# Patient Record
Sex: Female | Born: 1950 | Race: Black or African American | Hispanic: No | Smoking: Former smoker
Health system: Southern US, Community
[De-identification: ages and names within clinical notes are randomized; demographics above are authoritative.]

## PROBLEM LIST (undated history)

## (undated) DIAGNOSIS — I493 Ventricular premature depolarization: Secondary | ICD-10-CM

## (undated) DIAGNOSIS — E7800 Pure hypercholesterolemia, unspecified: Secondary | ICD-10-CM

## (undated) DIAGNOSIS — Z8719 Personal history of other diseases of the digestive system: Secondary | ICD-10-CM

## (undated) DIAGNOSIS — M2550 Pain in unspecified joint: Secondary | ICD-10-CM

## (undated) DIAGNOSIS — K760 Fatty (change of) liver, not elsewhere classified: Secondary | ICD-10-CM

## (undated) DIAGNOSIS — K5790 Diverticulosis of intestine, part unspecified, without perforation or abscess without bleeding: Secondary | ICD-10-CM

## (undated) DIAGNOSIS — R51 Headache: Secondary | ICD-10-CM

## (undated) DIAGNOSIS — R112 Nausea with vomiting, unspecified: Secondary | ICD-10-CM

## (undated) DIAGNOSIS — H409 Unspecified glaucoma: Secondary | ICD-10-CM

## (undated) DIAGNOSIS — Z9889 Other specified postprocedural states: Secondary | ICD-10-CM

## (undated) DIAGNOSIS — R519 Headache, unspecified: Secondary | ICD-10-CM

## (undated) DIAGNOSIS — M1990 Unspecified osteoarthritis, unspecified site: Secondary | ICD-10-CM

## (undated) DIAGNOSIS — I10 Essential (primary) hypertension: Secondary | ICD-10-CM

## (undated) DIAGNOSIS — E079 Disorder of thyroid, unspecified: Secondary | ICD-10-CM

## (undated) DIAGNOSIS — Z87440 Personal history of urinary (tract) infections: Secondary | ICD-10-CM

## (undated) HISTORY — PX: COLONOSCOPY: SHX174

## (undated) HISTORY — PX: APPENDECTOMY: SHX54

## (undated) HISTORY — PX: BREAST EXCISIONAL BIOPSY: SUR124

## (undated) HISTORY — PX: JOINT REPLACEMENT: SHX530

---

## 1972-09-21 HISTORY — PX: CHOLECYSTECTOMY: SHX55

## 1976-09-21 HISTORY — PX: TUBAL LIGATION: SHX77

## 1999-09-22 HISTORY — PX: KNEE ARTHROSCOPY: SHX127

## 2000-09-21 HISTORY — PX: BREAST SURGERY: SHX581

## 2000-09-21 HISTORY — PX: CARDIAC CATHETERIZATION: SHX172

## 2011-12-29 ENCOUNTER — Other Ambulatory Visit (HOSPITAL_COMMUNITY)
Admission: RE | Admit: 2011-12-29 | Discharge: 2011-12-29 | Disposition: A | Discharge status: Home / Self Care | Payer: Managed Care, Other (non HMO) | Source: Ambulatory Visit | Attending: Obstetrics and Gynecology | Admitting: Obstetrics and Gynecology

## 2011-12-29 DIAGNOSIS — Z1272 Encounter for screening for malignant neoplasm of vagina: Secondary | ICD-10-CM | POA: Insufficient documentation

## 2013-01-09 ENCOUNTER — Encounter (HOSPITAL_COMMUNITY): Payer: Self-pay | Admitting: Pharmacy Technician

## 2013-01-10 ENCOUNTER — Encounter (HOSPITAL_COMMUNITY)
Admission: RE | Admit: 2013-01-10 | Discharge: 2013-01-10 | Disposition: A | Discharge status: Home / Self Care | Payer: Managed Care, Other (non HMO) | Source: Ambulatory Visit | Attending: Orthopedic Surgery | Admitting: Orthopedic Surgery

## 2013-01-10 ENCOUNTER — Encounter (HOSPITAL_COMMUNITY): Payer: Self-pay

## 2013-01-10 ENCOUNTER — Other Ambulatory Visit: Payer: Self-pay | Admitting: Orthopedic Surgery

## 2013-01-10 ENCOUNTER — Ambulatory Visit (HOSPITAL_COMMUNITY)
Admission: RE | Admit: 2013-01-10 | Discharge: 2013-01-10 | Disposition: A | Discharge status: Home / Self Care | Payer: Managed Care, Other (non HMO) | Source: Ambulatory Visit | Attending: Anesthesiology | Admitting: Anesthesiology

## 2013-01-10 DIAGNOSIS — I10 Essential (primary) hypertension: Secondary | ICD-10-CM | POA: Insufficient documentation

## 2013-01-10 DIAGNOSIS — Z01812 Encounter for preprocedural laboratory examination: Secondary | ICD-10-CM | POA: Insufficient documentation

## 2013-01-10 DIAGNOSIS — Z01818 Encounter for other preprocedural examination: Secondary | ICD-10-CM | POA: Insufficient documentation

## 2013-01-10 HISTORY — DX: Pain in unspecified joint: M25.50

## 2013-01-10 HISTORY — DX: Unspecified osteoarthritis, unspecified site: M19.90

## 2013-01-10 HISTORY — DX: Nausea with vomiting, unspecified: R11.2

## 2013-01-10 HISTORY — DX: Pure hypercholesterolemia, unspecified: E78.00

## 2013-01-10 HISTORY — DX: Disorder of thyroid, unspecified: E07.9

## 2013-01-10 HISTORY — DX: Other specified postprocedural states: Z98.890

## 2013-01-10 HISTORY — DX: Personal history of other diseases of the digestive system: Z87.19

## 2013-01-10 HISTORY — DX: Unspecified glaucoma: H40.9

## 2013-01-10 HISTORY — DX: Essential (primary) hypertension: I10

## 2013-01-10 LAB — BASIC METABOLIC PANEL
CO2: 28 mEq/L (ref 19–32)
Calcium: 10 mg/dL (ref 8.4–10.5)
Creatinine, Ser: 0.99 mg/dL (ref 0.50–1.10)
GFR calc Af Amer: 69 mL/min — ABNORMAL LOW (ref >90)
GFR calc non Af Amer: 59 mL/min — ABNORMAL LOW (ref >90)
Sodium: 138 mEq/L (ref 135–145)

## 2013-01-10 LAB — CBC
HCT: 42.6 % (ref 36.0–46.0)
Hemoglobin: 14.7 g/dL (ref 12.0–15.0)
MCH: 30.3 pg (ref 26.0–34.0)
MCHC: 34.5 g/dL (ref 30.0–36.0)
MCV: 87.8 fL (ref 78.0–100.0)
Platelets: 189 10*3/uL (ref 150–400)
RBC: 4.85 MIL/uL (ref 3.87–5.11)
RDW: 14.1 % (ref 11.5–15.5)
WBC: 5.5 10*3/uL (ref 4.0–10.5)

## 2013-01-10 LAB — PROTIME-INR
INR: 1.05 (ref 0.00–1.49)
Prothrombin Time: 13.6 seconds (ref 11.6–15.2)

## 2013-01-10 LAB — SURGICAL PCR SCREEN
MRSA, PCR: NEGATIVE
Staphylococcus aureus: NEGATIVE

## 2013-01-10 LAB — APTT: aPTT: 36 seconds (ref 24–37)

## 2013-01-10 LAB — TYPE AND SCREEN: ABO/RH(D): O POS

## 2013-01-10 MED ORDER — CHLORHEXIDINE GLUCONATE 4 % EX LIQD: 60.0000 mL | Freq: Once | CUTANEOUS | Status: DC

## 2013-01-10 NOTE — Pre-Procedure Instructions (Signed)
Elizabeth Singh  01/10/2013   Your procedure is scheduled on:  Jan 23, 2013  Report to [facility] at 6:54 AM. (ENTRANCE A)  Call this number if you have problems the morning of surgery: [phone]   Remember:   Do not eat food or drink liquids after midnight.   Take these medicines the morning of surgery with A SIP OF WATER: METOPROLOL(LOPRESSOR), TRAMADOL(ULTRAM) AS NEEDED   Do not wear jewelry, make-up or nail polish.  Do not wear lotions, powders, or perfumes. You may wear deodorant.  Do not shave 48 hours prior to surgery. Men may shave face and neck.  Do not bring valuables to the hospital.  Contacts, dentures or bridgework may not be worn into surgery.  Leave suitcase in the car. After surgery it may be brought to your room.  For patients admitted to the hospital, checkout time is 11:00 AM the day of  discharge.   Patients discharged the day of surgery will not be allowed to drive  home.  Name and phone number of your driver: FAMILY/FRIEND  Special Instructions: Shower using CHG 2 nights before surgery and the night before surgery.  If you shower the day of surgery use CHG.  Use special wash - you have one bottle of CHG for all showers.  You should use approximately 1/3 of the bottle for each shower.   Please read over the following fact sheets that you were given: Pain Booklet, Coughing and Deep Breathing, Blood Transfusion Information, Total Joint Packet and Surgical Site Infection Prevention

## 2013-01-22 MED ORDER — DEXTROSE 5 % IV SOLN
3.0000 g | INTRAVENOUS | Status: AC
  Administered 2013-01-23: 3 g via INTRAVENOUS
  Filled 2013-01-22: qty 3000

## 2013-01-22 MED ORDER — TRANEXAMIC ACID 100 MG/ML IV SOLN
1000.0000 mg | INTRAVENOUS | Status: AC
  Administered 2013-01-23: 1000 mg via INTRAVENOUS
  Filled 2013-01-22: qty 10

## 2013-01-23 ENCOUNTER — Encounter (HOSPITAL_COMMUNITY): Payer: Self-pay | Admitting: Registered Nurse

## 2013-01-23 ENCOUNTER — Encounter (HOSPITAL_COMMUNITY): Payer: Self-pay | Admitting: Anesthesiology

## 2013-01-23 ENCOUNTER — Inpatient Hospital Stay (HOSPITAL_COMMUNITY)
Admission: RE | Admit: 2013-01-23 | Discharge: 2013-01-25 | DRG: 470 | Disposition: A | Discharge status: Home Health | Payer: Managed Care, Other (non HMO) | Source: Ambulatory Visit | Attending: Orthopedic Surgery | Admitting: Orthopedic Surgery

## 2013-01-23 ENCOUNTER — Encounter (HOSPITAL_COMMUNITY)
Admission: RE | Disposition: A | Discharge status: Home Health | Payer: Self-pay | Source: Ambulatory Visit | Attending: Orthopedic Surgery

## 2013-01-23 ENCOUNTER — Inpatient Hospital Stay (HOSPITAL_COMMUNITY): Payer: Managed Care, Other (non HMO) | Admitting: Registered Nurse

## 2013-01-23 DIAGNOSIS — Z79899 Other long term (current) drug therapy: Secondary | ICD-10-CM

## 2013-01-23 DIAGNOSIS — K219 Gastro-esophageal reflux disease without esophagitis: Secondary | ICD-10-CM | POA: Diagnosis present

## 2013-01-23 DIAGNOSIS — I10 Essential (primary) hypertension: Secondary | ICD-10-CM | POA: Diagnosis present

## 2013-01-23 DIAGNOSIS — E7800 Pure hypercholesterolemia, unspecified: Secondary | ICD-10-CM | POA: Diagnosis present

## 2013-01-23 DIAGNOSIS — M1710 Unilateral primary osteoarthritis, unspecified knee: Principal | ICD-10-CM | POA: Diagnosis present

## 2013-01-23 DIAGNOSIS — M1711 Unilateral primary osteoarthritis, right knee: Secondary | ICD-10-CM

## 2013-01-23 HISTORY — PX: TOTAL KNEE ARTHROPLASTY: SHX125

## 2013-01-23 LAB — CBC
MCH: 29.4 pg (ref 26.0–34.0)
Platelets: 174 10*3/uL (ref 150–400)
RBC: 4.12 MIL/uL (ref 3.87–5.11)
WBC: 11.2 10*3/uL — ABNORMAL HIGH (ref 4.0–10.5)

## 2013-01-23 LAB — CREATININE, SERUM
Creatinine, Ser: 0.77 mg/dL (ref 0.50–1.10)
GFR calc non Af Amer: 88 mL/min — ABNORMAL LOW (ref >90)

## 2013-01-23 SURGERY — ARTHROPLASTY, KNEE, TOTAL
Anesthesia: General | Site: Knee | Laterality: Right | Wound class: Clean

## 2013-01-23 MED ORDER — OXYCODONE HCL 5 MG PO TABS
5.0000 mg | ORAL_TABLET | Freq: Once | ORAL | Status: AC
  Administered 2013-01-23: 5 mg via ORAL

## 2013-01-23 MED ORDER — FENTANYL CITRATE 0.05 MG/ML IJ SOLN
INTRAMUSCULAR | Status: DC | PRN
  Administered 2013-01-23: 150 ug via INTRAVENOUS
  Administered 2013-01-23 (×6): 50 ug via INTRAVENOUS

## 2013-01-23 MED ORDER — CEFAZOLIN SODIUM-DEXTROSE 2-3 GM-% IV SOLR
2.0000 g | Freq: Four times a day (QID) | INTRAVENOUS | Status: AC
  Administered 2013-01-23 (×2): 2 g via INTRAVENOUS
  Filled 2013-01-23 (×2): qty 50

## 2013-01-23 MED ORDER — BISACODYL 5 MG PO TBEC: 5.0000 mg | DELAYED_RELEASE_TABLET | Freq: Every day | ORAL | Status: DC | PRN

## 2013-01-23 MED ORDER — BUPIVACAINE-EPINEPHRINE PF 0.25-1:200000 % IJ SOLN
INTRAMUSCULAR | Status: AC
  Filled 2013-01-23: qty 30

## 2013-01-23 MED ORDER — HYDROMORPHONE HCL PF 1 MG/ML IJ SOLN
INTRAMUSCULAR | Status: AC
  Filled 2013-01-23: qty 1

## 2013-01-23 MED ORDER — DOCUSATE SODIUM 100 MG PO CAPS
100.0000 mg | ORAL_CAPSULE | Freq: Two times a day (BID) | ORAL | Status: DC
  Administered 2013-01-23 – 2013-01-25 (×5): 100 mg via ORAL
  Filled 2013-01-23 (×5): qty 1

## 2013-01-23 MED ORDER — ALUM & MAG HYDROXIDE-SIMETH 200-200-20 MG/5ML PO SUSP: 30.0000 mL | ORAL | Status: DC | PRN

## 2013-01-23 MED ORDER — SODIUM CHLORIDE 0.9 % IR SOLN
Status: DC | PRN
  Administered 2013-01-23: 3000 mL

## 2013-01-23 MED ORDER — BUPIVACAINE ON-Q PAIN PUMP (FOR ORDER SET NO CHG)
INJECTION | Status: DC
Start: 2013-01-23 — End: 2013-01-25
  Filled 2013-01-23: qty 1

## 2013-01-23 MED ORDER — METOCLOPRAMIDE HCL 10 MG PO TABS: 5.0000 mg | ORAL_TABLET | Freq: Three times a day (TID) | ORAL | Status: DC | PRN

## 2013-01-23 MED ORDER — METHOCARBAMOL 100 MG/ML IJ SOLN: 500.0000 mg | Freq: Four times a day (QID) | INTRAVENOUS | Status: DC | PRN

## 2013-01-23 MED ORDER — ONDANSETRON HCL 4 MG/2ML IJ SOLN
INTRAMUSCULAR | Status: DC | PRN
  Administered 2013-01-23 (×2): 4 mg via INTRAVENOUS

## 2013-01-23 MED ORDER — ONDANSETRON HCL 4 MG PO TABS: 4.0000 mg | ORAL_TABLET | Freq: Four times a day (QID) | ORAL | Status: DC | PRN

## 2013-01-23 MED ORDER — ACETAMINOPHEN 10 MG/ML IV SOLN
1000.0000 mg | Freq: Once | INTRAVENOUS | Status: AC
  Administered 2013-01-23: 1000 mg via INTRAVENOUS

## 2013-01-23 MED ORDER — OXYCODONE HCL 5 MG PO TABS
5.0000 mg | ORAL_TABLET | ORAL | Status: DC | PRN
  Administered 2013-01-23 – 2013-01-25 (×10): 10 mg via ORAL
  Filled 2013-01-23 (×11): qty 2

## 2013-01-23 MED ORDER — ZOLPIDEM TARTRATE 5 MG PO TABS: 5.0000 mg | ORAL_TABLET | Freq: Every evening | ORAL | Status: DC | PRN

## 2013-01-23 MED ORDER — SENNOSIDES-DOCUSATE SODIUM 8.6-50 MG PO TABS: 1.0000 | ORAL_TABLET | Freq: Every evening | ORAL | Status: DC | PRN

## 2013-01-23 MED ORDER — SODIUM CHLORIDE 0.9 % IV SOLN
INTRAVENOUS | Status: DC
  Administered 2013-01-23 – 2013-01-24 (×2): via INTRAVENOUS

## 2013-01-23 MED ORDER — POTASSIUM CHLORIDE CRYS ER 20 MEQ PO TBCR
20.0000 meq | EXTENDED_RELEASE_TABLET | Freq: Every day | ORAL | Status: DC
  Administered 2013-01-23 – 2013-01-25 (×3): 20 meq via ORAL
  Filled 2013-01-23 (×4): qty 1

## 2013-01-23 MED ORDER — BUPIVACAINE-EPINEPHRINE 0.25% -1:200000 IJ SOLN
INTRAMUSCULAR | Status: DC | PRN
  Administered 2013-01-23: 20 mL

## 2013-01-23 MED ORDER — PHENOL 1.4 % MT LIQD: 1.0000 | OROMUCOSAL | Status: DC | PRN

## 2013-01-23 MED ORDER — PHENYLEPHRINE HCL 10 MG/ML IJ SOLN
INTRAMUSCULAR | Status: DC | PRN
  Administered 2013-01-23: 80 ug via INTRAVENOUS
  Administered 2013-01-23: 120 ug via INTRAVENOUS
  Administered 2013-01-23: 80 ug via INTRAVENOUS

## 2013-01-23 MED ORDER — ONDANSETRON HCL 4 MG/2ML IJ SOLN: 4.0000 mg | Freq: Once | INTRAMUSCULAR | Status: DC | PRN

## 2013-01-23 MED ORDER — HYDROMORPHONE HCL PF 1 MG/ML IJ SOLN
0.2500 mg | INTRAMUSCULAR | Status: DC | PRN
  Administered 2013-01-23 (×4): 0.5 mg via INTRAVENOUS

## 2013-01-23 MED ORDER — HYDROMORPHONE HCL PF 1 MG/ML IJ SOLN
1.0000 mg | INTRAMUSCULAR | Status: DC | PRN
  Administered 2013-01-23 – 2013-01-24 (×2): 1 mg via INTRAVENOUS
  Administered 2013-01-24: 0.5 mg via INTRAVENOUS
  Filled 2013-01-23 (×3): qty 1

## 2013-01-23 MED ORDER — METOPROLOL TARTRATE 50 MG PO TABS
50.0000 mg | ORAL_TABLET | Freq: Two times a day (BID) | ORAL | Status: DC
  Administered 2013-01-23 – 2013-01-25 (×4): 50 mg via ORAL
  Filled 2013-01-23 (×6): qty 1

## 2013-01-23 MED ORDER — ONDANSETRON HCL 4 MG/2ML IJ SOLN: 4.0000 mg | Freq: Four times a day (QID) | INTRAMUSCULAR | Status: DC | PRN

## 2013-01-23 MED ORDER — METHOCARBAMOL 500 MG PO TABS
ORAL_TABLET | ORAL | Status: AC
  Administered 2013-01-23: 500 mg
  Filled 2013-01-23: qty 1

## 2013-01-23 MED ORDER — BUPIVACAINE 0.25 % ON-Q PUMP SINGLE CATH 300ML
300.0000 mL | INJECTION | Status: DC
  Filled 2013-01-23: qty 300

## 2013-01-23 MED ORDER — OXYCODONE HCL ER 10 MG PO T12A
10.0000 mg | EXTENDED_RELEASE_TABLET | Freq: Two times a day (BID) | ORAL | Status: DC
  Administered 2013-01-23 – 2013-01-25 (×5): 10 mg via ORAL
  Filled 2013-01-23 (×5): qty 1

## 2013-01-23 MED ORDER — METOCLOPRAMIDE HCL 5 MG/ML IJ SOLN
5.0000 mg | Freq: Three times a day (TID) | INTRAMUSCULAR | Status: DC | PRN
  Administered 2013-01-23 – 2013-01-24 (×2): 10 mg via INTRAVENOUS
  Filled 2013-01-23 (×2): qty 2

## 2013-01-23 MED ORDER — SODIUM CHLORIDE 0.9 % IV SOLN: INTRAVENOUS | Status: DC

## 2013-01-23 MED ORDER — LIDOCAINE HCL (CARDIAC) 20 MG/ML IV SOLN
INTRAVENOUS | Status: DC | PRN
  Administered 2013-01-23: 80 mg via INTRAVENOUS

## 2013-01-23 MED ORDER — ACETAMINOPHEN 10 MG/ML IV SOLN
1000.0000 mg | Freq: Four times a day (QID) | INTRAVENOUS | Status: AC
  Administered 2013-01-23 (×3): 1000 mg via INTRAVENOUS
  Filled 2013-01-23 (×4): qty 100

## 2013-01-23 MED ORDER — LACTATED RINGERS IV SOLN
INTRAVENOUS | Status: DC | PRN
  Administered 2013-01-23 (×2): via INTRAVENOUS

## 2013-01-23 MED ORDER — MENTHOL 3 MG MT LOZG: 1.0000 | LOZENGE | OROMUCOSAL | Status: DC | PRN

## 2013-01-23 MED ORDER — ACETAMINOPHEN 10 MG/ML IV SOLN
INTRAVENOUS | Status: AC
  Filled 2013-01-23: qty 100

## 2013-01-23 MED ORDER — DIPHENHYDRAMINE HCL 12.5 MG/5ML PO ELIX: 12.5000 mg | ORAL_SOLUTION | ORAL | Status: DC | PRN

## 2013-01-23 MED ORDER — PROPOFOL 10 MG/ML IV BOLUS
INTRAVENOUS | Status: DC | PRN
  Administered 2013-01-23: 320 mg via INTRAVENOUS

## 2013-01-23 MED ORDER — TRANEXAMIC ACID 100 MG/ML IV SOLN: 1000.0000 mg | INTRAVENOUS | Status: AC

## 2013-01-23 MED ORDER — FLEET ENEMA 7-19 GM/118ML RE ENEM: 1.0000 | ENEMA | Freq: Once | RECTAL | Status: AC | PRN

## 2013-01-23 MED ORDER — ENOXAPARIN SODIUM 30 MG/0.3ML ~~LOC~~ SOLN
30.0000 mg | Freq: Two times a day (BID) | SUBCUTANEOUS | Status: DC
  Administered 2013-01-24 – 2013-01-25 (×3): 30 mg via SUBCUTANEOUS
  Filled 2013-01-23 (×6): qty 0.3

## 2013-01-23 MED ORDER — ACETAMINOPHEN 325 MG PO TABS
650.0000 mg | ORAL_TABLET | Freq: Four times a day (QID) | ORAL | Status: DC | PRN
  Administered 2013-01-24: 650 mg via ORAL
  Filled 2013-01-23: qty 2

## 2013-01-23 MED ORDER — BUPIVACAINE 0.25 % ON-Q PUMP SINGLE CATH 300ML
INJECTION | Status: DC | PRN
  Administered 2013-01-23: 300 mL

## 2013-01-23 MED ORDER — ACETAMINOPHEN 650 MG RE SUPP: 650.0000 mg | Freq: Four times a day (QID) | RECTAL | Status: DC | PRN

## 2013-01-23 MED ORDER — METHOCARBAMOL 500 MG PO TABS
500.0000 mg | ORAL_TABLET | Freq: Four times a day (QID) | ORAL | Status: DC | PRN
  Administered 2013-01-23 – 2013-01-25 (×6): 500 mg via ORAL
  Filled 2013-01-23 (×6): qty 1

## 2013-01-23 MED ORDER — TRIAMTERENE-HCTZ 75-50 MG PO TABS
1.0000 | ORAL_TABLET | Freq: Every day | ORAL | Status: DC
  Administered 2013-01-23 – 2013-01-24 (×2): 1 via ORAL
  Filled 2013-01-23 (×3): qty 1

## 2013-01-23 MED ORDER — OXYCODONE HCL 5 MG PO TABS
ORAL_TABLET | ORAL | Status: AC
  Filled 2013-01-23: qty 1

## 2013-01-23 MED ORDER — LATANOPROST 0.005 % OP SOLN
1.0000 [drp] | Freq: Every day | OPHTHALMIC | Status: DC
  Administered 2013-01-23 – 2013-01-24 (×2): 1 [drp] via OPHTHALMIC
  Filled 2013-01-23: qty 2.5

## 2013-01-23 MED ORDER — MIDAZOLAM HCL 5 MG/5ML IJ SOLN
INTRAMUSCULAR | Status: DC | PRN
  Administered 2013-01-23 (×2): 1 mg via INTRAVENOUS

## 2013-01-23 SURGICAL SUPPLY — 59 items
BANDAGE ESMARK 6X9 LF (GAUZE/BANDAGES/DRESSINGS) ×1 IMPLANT
BLADE SAGITTAL 13X1.27X60 (BLADE) ×2 IMPLANT
BLADE SAW SGTL 83.5X18.5 (BLADE) ×2 IMPLANT
BNDG ESMARK 6X9 LF (GAUZE/BANDAGES/DRESSINGS) ×2
BOWL SMART MIX CTS (DISPOSABLE) ×2 IMPLANT
CATH KIT ON Q 5IN SLV (PAIN MANAGEMENT) ×2 IMPLANT
CEMENT BONE SIMPLEX SPEEDSET (Cement) ×4 IMPLANT
CLOTH BEACON ORANGE TIMEOUT ST (SAFETY) ×2 IMPLANT
COVER SURGICAL LIGHT HANDLE (MISCELLANEOUS) ×2 IMPLANT
CUFF TOURNIQUET SINGLE 34IN LL (TOURNIQUET CUFF) ×2 IMPLANT
DRAPE EXTREMITY T 121X128X90 (DRAPE) ×2 IMPLANT
DRAPE INCISE IOBAN 66X45 STRL (DRAPES) ×4 IMPLANT
DRAPE PROXIMA HALF (DRAPES) ×2 IMPLANT
DRAPE U-SHAPE 47X51 STRL (DRAPES) ×2 IMPLANT
DRSG ADAPTIC 3X8 NADH LF (GAUZE/BANDAGES/DRESSINGS) ×2 IMPLANT
DRSG PAD ABDOMINAL 8X10 ST (GAUZE/BANDAGES/DRESSINGS) ×2 IMPLANT
DURAPREP 26ML APPLICATOR (WOUND CARE) ×4 IMPLANT
ELECT REM PT RETURN 9FT ADLT (ELECTROSURGICAL) ×2
ELECTRODE REM PT RTRN 9FT ADLT (ELECTROSURGICAL) ×1 IMPLANT
EVACUATOR 1/8 PVC DRAIN (DRAIN) ×2 IMPLANT
GLOVE BIO SURGEON STRL SZ 6.5 (GLOVE) ×6 IMPLANT
GLOVE BIO SURGEON STRL SZ8.5 (GLOVE) ×6 IMPLANT
GLOVE BIOGEL M 7.0 STRL (GLOVE) ×2 IMPLANT
GLOVE BIOGEL PI IND STRL 7.5 (GLOVE) ×1 IMPLANT
GLOVE BIOGEL PI IND STRL 8.5 (GLOVE) ×2 IMPLANT
GLOVE BIOGEL PI INDICATOR 7.5 (GLOVE) ×1
GLOVE BIOGEL PI INDICATOR 8.5 (GLOVE) ×2
GLOVE BIOGEL PI ORTHO PRO SZ8 (GLOVE) ×1
GLOVE PI ORTHO PRO STRL SZ8 (GLOVE) ×1 IMPLANT
GLOVE SURG ORTHO 8.0 STRL STRW (GLOVE) ×4 IMPLANT
GLOVE SURG SS PI 8.5 STRL IVOR (GLOVE) ×5
GLOVE SURG SS PI 8.5 STRL STRW (GLOVE) ×5 IMPLANT
GOWN PREVENTION PLUS XLARGE (GOWN DISPOSABLE) ×4 IMPLANT
GOWN STRL NON-REIN LRG LVL3 (GOWN DISPOSABLE) ×4 IMPLANT
HANDPIECE INTERPULSE COAX TIP (DISPOSABLE) ×1
HOOD PEEL AWAY FACE SHEILD DIS (HOOD) ×8 IMPLANT
KIT BASIN OR (CUSTOM PROCEDURE TRAY) ×2 IMPLANT
KIT ROOM TURNOVER OR (KITS) ×2 IMPLANT
MANIFOLD NEPTUNE II (INSTRUMENTS) ×2 IMPLANT
NEEDLE 22X1 1/2 (OR ONLY) (NEEDLE) ×2 IMPLANT
NS IRRIG 1000ML POUR BTL (IV SOLUTION) ×2 IMPLANT
PACK TOTAL JOINT (CUSTOM PROCEDURE TRAY) ×2 IMPLANT
PAD ARMBOARD 7.5X6 YLW CONV (MISCELLANEOUS) ×4 IMPLANT
PADDING CAST COTTON 6X4 STRL (CAST SUPPLIES) ×2 IMPLANT
SET HNDPC FAN SPRY TIP SCT (DISPOSABLE) ×1 IMPLANT
SPONGE GAUZE 4X4 12PLY (GAUZE/BANDAGES/DRESSINGS) ×2 IMPLANT
STAPLER VISISTAT 35W (STAPLE) ×2 IMPLANT
SUCTION FRAZIER TIP 10 FR DISP (SUCTIONS) ×2 IMPLANT
SUT BONE WAX W31G (SUTURE) ×2 IMPLANT
SUT VIC AB 0 CTB1 27 (SUTURE) ×4 IMPLANT
SUT VIC AB 1 CT1 27 (SUTURE) ×2
SUT VIC AB 1 CT1 27XBRD ANBCTR (SUTURE) ×2 IMPLANT
SUT VIC AB 2-0 CT1 27 (SUTURE) ×2
SUT VIC AB 2-0 CT1 TAPERPNT 27 (SUTURE) ×2 IMPLANT
SYR CONTROL 10ML LL (SYRINGE) ×2 IMPLANT
TOWEL OR 17X24 6PK STRL BLUE (TOWEL DISPOSABLE) ×2 IMPLANT
TOWEL OR 17X26 10 PK STRL BLUE (TOWEL DISPOSABLE) ×2 IMPLANT
TRAY FOLEY CATH 14FR (SET/KITS/TRAYS/PACK) ×2 IMPLANT
WATER STERILE IRR 1000ML POUR (IV SOLUTION) ×4 IMPLANT

## 2013-01-23 NOTE — Anesthesia Postprocedure Evaluation (Signed)
  Anesthesia Post-op Note  Patient: Elizabeth Singh  Procedure(s) Performed: Procedure(s): TOTAL KNEE ARTHROPLASTY (Right)  Patient Location: PACU  Anesthesia Type:GA combined with regional for post-op pain  Level of Consciousness: awake, oriented and patient cooperative  Airway and Oxygen Therapy: Patient Spontanous Breathing  Post-op Pain: mild  Post-op Assessment: Post-op Vital signs reviewed, Patient's Cardiovascular Status Stable, Respiratory Function Stable, Patent Airway, No signs of Nausea or vomiting and Pain level controlled  Post-op Vital Signs: stable  Complications: No apparent anesthesia complications

## 2013-01-23 NOTE — Evaluation (Signed)
Physical Therapy Evaluation Patient Details Name: Elizabeth Singh MRN: [id number] DOB: 01/11/1951 Today's Date: 01/23/2013 Time: 1330-1400 PT Time Calculation (min): 30 min  PT Assessment / Plan / Recommendation Clinical Impression  Pt is a 62 y.o. female s/p R TKA POD#0. Pt limited in therapy today secondary to drowsiness and dizziniess due to medication. Pt preseents with decreased mobility, strength and ROM in R knee. Would benefit from skilled PT to maximize functional mobility. Plans to have 24/7 care by daugther upon D/C. Recommend HHPT.     PT Assessment  Patient needs continued PT services    Follow Up Recommendations  Home health PT;Supervision/Assistance - 24 hour    Does the patient have the potential to tolerate intense rehabilitation      Barriers to Discharge        Equipment Recommendations  None recommended by PT    Recommendations for Other Services     Frequency 7X/week    Precautions / Restrictions Precautions Precautions: Fall;Knee Precaution Booklet Issued: Yes (comment) Restrictions Weight Bearing Restrictions: Yes RLE Weight Bearing: Weight bearing as tolerated   Pertinent Vitals/Pain C/o 8/10 in R knee with activity; pt repositioned in bed.       Mobility  Bed Mobility Bed Mobility: Supine to Sit;Sit to Supine;Sitting - Scoot to Edge of Bed Supine to Sit: 4: Min assist;HOB elevated (HOB at 35degrees) Sitting - Scoot to Edge of Bed: 4: Min guard Sit to Supine: 4: Min guard;HOB flat;With rail Details for Bed Mobility Assistance: pt requires increased time to complete tasks secondary to pain; multi modal cues for hand placement and sequencing; pt demo good technique; using bil UEs to assist R LE. Transfers Transfers: Sit to Stand;Stand to Sit Sit to Stand: 4: Min assist;From bed;From elevated surface;With upper extremity assist Stand to Sit: 4: Min assist;To bed;With upper extremity assist Details for Transfer Assistance: pt c/o dizziniess with sit  <> stand; tolerated static standing x ; required return to supine position; required multimodal cues for hand placement and safety; demo decreased safety awareness.  Ambulation/Gait Ambulation/Gait Assistance: Not tested (comment) Stairs: No Wheelchair Mobility Wheelchair Mobility: No    Exercises     PT Diagnosis: Difficulty walking;Acute pain  PT Problem List: Decreased strength;Decreased range of motion;Decreased balance;Decreased mobility;Decreased knowledge of use of DME;Decreased safety awareness;Pain PT Treatment Interventions: DME instruction;Gait training;Functional mobility training;Therapeutic activities;Therapeutic exercise;Balance training;Neuromuscular re-education;Patient/family education   PT Goals Acute Rehab PT Goals PT Goal Formulation: With patient Time For Goal Achievement: 01/30/13 Potential to Achieve Goals: Good Pt will go Supine/Side to Sit: with modified independence PT Goal: Supine/Side to Sit - Progress: Goal set today Pt will go Sit to Stand: with supervision PT Goal: Sit to Stand - Progress: Goal set today Pt will go Stand to Sit: with modified independence PT Goal: Stand to Sit - Progress: Goal set today Pt will Ambulate: >150 feet;with rolling walker;with modified independence PT Goal: Ambulate - Progress: Goal set today Pt will Perform Home Exercise Program: with supervision, verbal cues required/provided PT Goal: Perform Home Exercise Program - Progress: Goal set today  Visit Information  Last PT Received On: 01/23/13 Assistance Needed: +1    Subjective Data  Subjective: "not feeling too much pain right now." Lying supine; agreeable to therapy Patient Stated Goal: to go home with daughter    Prior Functioning  Home Living Lives With: Alone Available Help at Discharge: Family;Available 24 hours/day (for 1 week) Type of Home: House Home Layout: One level Bathroom Shower/Tub: Engineer, manufacturing systems: Standard  Bathroom  Accessibility: Yes How Accessible: Accessible via walker Home Adaptive Equipment: Bedside commode/3-in-1;Walker - rolling Prior Function Level of Independence: Independent Able to Take Stairs?: Yes Driving: Yes Vocation: Full time employment Communication Communication: No difficulties Dominant Hand: Right    Cognition  Cognition Arousal/Alertness: Lethargic Behavior During Therapy: WFL for tasks assessed/performed Overall Cognitive Status: Within Functional Limits for tasks assessed    Extremity/Trunk Assessment Right Lower Extremity Assessment RLE ROM/Strength/Tone: Deficits;Unable to fully assess;Due to pain RLE ROM/Strength/Tone Deficits: required (A) for SLR and LAQ; c/o pain with activity  RLE Sensation: WFL - Light Touch Left Lower Extremity Assessment LLE ROM/Strength/Tone: WFL for tasks assessed LLE Sensation: WFL - Light Touch Trunk Assessment Trunk Assessment: Normal   Balance Balance Balance Assessed: Yes Static Standing Balance Static Standing - Balance Support: Bilateral upper extremity supported Static Standing - Level of Assistance: 4: Min assist  End of Session PT - End of Session Equipment Utilized During Treatment: Gait belt Activity Tolerance: Patient limited by fatigue;Patient limited by pain;Treatment limited secondary to medication;Other (comment) (pt very drowsy) Patient left: in bed;with call bell/phone within reach;with nursing in room;with family/visitor present Nurse Communication: Mobility status CPM Right Knee CPM Right Knee: Off Right Knee Flexion (Degrees): 90 Right Knee Extension (Degrees): 0  GP     Shelva Majestic [name], [place] [phone] 01/23/2013, 2:39 PM

## 2013-01-23 NOTE — H&P (Signed)
Elizabeth Singh MRN:  [id number] DOB/SEX:  1951/08/18/female  CHIEF COMPLAINT:  Painful right Knee  HISTORY: Patient is a 62 y.o. female presented with a history of pain in the right knee. Onset of symptoms was gradual starting several years ago with gradually worsening course since that time. Prior procedures on the knee include arthroscopy. Patient has been treated conservatively with over-the-counter NSAIDs and activity modification. Patient currently rates pain in the knee at 9 out of 10 with activity. There is pain at night.  PAST MEDICAL HISTORY: There are no active problems to display for this patient.  Past Medical History  Diagnosis Date  . PONV (postoperative nausea and vomiting)     once post surgery  . Hypertension   . High cholesterol   . Arthritis   . Joint pain   . GERD (gastroesophageal reflux disease)   . H/O hiatal hernia   . Thyroid disorder   . Glaucoma    Past Surgical History  Procedure Laterality Date  . Cholecystectomy  1974  . Tubal ligation  1978  . Breast surgery  2002    left benign lumpectomy  . Knee arthroscopy  2001    right  . Cardiac catheterization  2002     MEDICATIONS:   Prescriptions prior to admission  Medication Sig Dispense Refill  . amiloride-hydrochlorothiazide (MODURETIC) 5-50 MG tablet Take 1 tablet by mouth daily.      . diflunisal (DOLOBID) 500 MG TABS Take 500 mg by mouth 2 (two) times daily.      [name] Kitchen KRILL OIL PO Take 1 tablet by mouth daily.      [name] Kitchen latanoprost (XALATAN) 0.005 % ophthalmic solution Place 1 drop into both eyes at bedtime.      . metoprolol (LOPRESSOR) 50 MG tablet Take 50 mg by mouth 2 (two) times daily.      . potassium chloride SA (K-DUR,KLOR-CON) 20 MEQ tablet Take 20 mEq by mouth every morning.      . traMADol (ULTRAM) 50 MG tablet Take 25 mg by mouth 2 (two) times daily as needed for pain.        ALLERGIES:   Allergies  Allergen Reactions  . Sulfa Antibiotics Itching    REVIEW OF SYSTEMS:   Pertinent items are noted in HPI.   FAMILY HISTORY:  No family history on file.  SOCIAL HISTORY:   History  Substance Use Topics  . Smoking status: Former Games developer  . Smokeless tobacco: Not on file  . Alcohol Use: Yes     Comment: occassional     EXAMINATION:  Vital signs in last 24 hours:    General appearance: alert, cooperative and no distress Lungs: clear to auscultation bilaterally Heart: regular rate and rhythm, S1, S2 normal, no murmur, click, rub or gallop Abdomen: soft, non-tender; bowel sounds normal; no masses,  no organomegaly Extremities: extremities normal, atraumatic, no cyanosis or edema and Homans sign is negative, no sign of DVT Pulses: 2+ and symmetric Skin: Skin color, texture, turgor normal. No rashes or lesions Neurologic: Alert and oriented X 3, normal strength and tone. Normal symmetric reflexes. Normal coordination and gait  Musculoskeletal:  ROM 0-115, Ligaments intact,  Imaging Review Plain radiographs demonstrate severe degenerative joint disease of the right knee. The overall alignment is mild valgus. The bone quality appears to be good for age and reported activity level.  Assessment/Plan: End stage arthritis, right knee   The patient history, physical examination and imaging studies are consistent with advanced degenerative joint disease of the  right knee. The patient has failed conservative treatment.  The clearance notes were reviewed.  After discussion with the patient it was felt that Total Knee Replacement was indicated. The procedure,  risks, and benefits of total knee arthroplasty were presented and reviewed. The risks including but not limited to aseptic loosening, infection, blood clots, vascular injury, stiffness, patella tracking problems complications among others were discussed. The patient acknowledged the explanation, agreed to proceed with the plan.  Christella App 01/23/2013, 6:47 AM

## 2013-01-23 NOTE — Anesthesia Procedure Notes (Signed)
Anesthesia Regional Block:  Femoral nerve block  Pre-Anesthetic Checklist: ,, timeout performed, Correct Patient, Correct Site, Correct Laterality, Correct Procedure, Correct Position, site marked, Risks and benefits discussed,  Surgical consent,  Pre-op evaluation,  At surgeon's request and post-op pain management  Laterality: Right  Prep: Maximum Sterile Barrier Precautions used, chloraprep and alcohol swabs       Needles:  Injection technique: Single-shot  Needle Type: Stimulator Needle - 80          Additional Needles: Femoral nerve block  Nerve Stimulator or Paresthesia:  Response: 0.5 mA, 0.1 ms, 6 cm  Additional Responses:   Narrative:  Start time: 01/23/2013 8:00 AM End time: 01/23/2013 8:07 AM Injection made incrementally with aspirations every 5 mL.  Performed by: Personally  Anesthesiologist: Maren Beach MD  Additional Notes: Pt accepts procedure and risks. 20cc 0.5% Marcaine w/ epi w/o difficulty or discomfort. GES

## 2013-01-23 NOTE — Preoperative (Signed)
Beta Blockers   Reason not to administer Beta Blockers:Not Applicable 

## 2013-01-23 NOTE — Transfer of Care (Signed)
Immediate Anesthesia Transfer of Care Note  Patient: Elizabeth Singh  Procedure(s) Performed: Procedure(s): TOTAL KNEE ARTHROPLASTY (Right)  Patient Location: PACU  Anesthesia Type:General  Level of Consciousness: awake, alert  and oriented  Airway & Oxygen Therapy: Patient Spontanous Breathing and Patient connected to nasal cannula oxygen  Post-op Assessment: Report given to PACU RN  Post vital signs: Reviewed  Complications: No apparent anesthesia complications

## 2013-01-23 NOTE — Progress Notes (Signed)
Orthopedic Tech Progress Note Patient Details:  Elizabeth Singh 21-Oct-1950 [id number] CPM applied to Right LE with appropriate settings. OHF applied to bed. Footsie Roll provided.  CPM Right Knee Right Knee Flexion (Degrees): 90 Right Knee Extension (Degrees): 0   Asia R Thompson 01/23/2013, 11:19 AM

## 2013-01-23 NOTE — Op Note (Signed)
TOTAL KNEE REPLACEMENT OPERATIVE NOTE:  01/23/2013  4:38 PM  PATIENT:  Elizabeth Singh  62 y.o. female  PRE-OPERATIVE DIAGNOSIS:  osteoarthritis right knee  POST-OPERATIVE DIAGNOSIS:  osteoarthritis right knee  PROCEDURE:  Procedure(s): TOTAL KNEE ARTHROPLASTY  SURGEON:  Surgeon(s): Dannielle Huh, MD  PHYSICIAN ASSISTANT: Altamese Cabal, [facility]  ANESTHESIA:   general  DRAINS: Hemovac and On-Q Marcaine Pain Pump  SPECIMEN: None  COUNTS:  Correct  TOURNIQUET:   Total Tourniquet Time Documented: Thigh (Right) - 49 minutes Total: Thigh (Right) - 49 minutes   DICTATION:  Indication for procedure:    The patient is a 62 y.o. female who has failed conservative treatment for osteoarthritis right knee.  Informed consent was obtained prior to anesthesia. The risks versus benefits of the operation were explain and in a way the patient can, and did, understand.   Description of procedure:     The patient was taken to the operating room and placed under anesthesia.  The patient was positioned in the usual fashion taking care that all body parts were adequately padded and/or protected.  I foley catheter was placed.  A tourniquet was applied and the leg prepped and draped in the usual sterile fashion.  The extremity was exsanguinated with the esmarch and tourniquet inflated to 350 mmHg.  Pre-operative range of motion was normal.  The knee was in 3 degree of mild varus.  A midline incision approximately 6-7 inches long was made with a #10 blade.  A new blade was used to make a parapatellar arthrotomy going 2-3 cm into the quadriceps tendon, over the patella, and alongside the medial aspect of the patellar tendon.  A synovectomy was then performed with the #10 blade and forceps. I then elevated the deep MCL off the medial tibial metaphysis subperiosteally around to the semimembranosus attachment.    I everted the patella and used calipers to measure patellar thickness.  I used the reamer to ream  down to appropriate thickness to recreate the native thickness.  I then removed excess bone with the rongeur and sagittal saw.  I used the appropriately sized template and drilled the three lug holes.  I then put the trial in place and measured the thickness with the calipers to ensure recreation of the native thickness.  The trial was then removed and the patella subluxed and the knee brought into flexion.  A homan retractor was place to retract and protect the patella and lateral structures.  A Z-retractor was place medially to protect the medial structures.  The extra-medullary alignment system was used to make cut the tibial articular surface perpendicular to the anamotic axis of the tibia and in 3 degrees of posterior slope.  The cut surface and alignment jig was removed.  I then used the intramedullary alignment guide to make a 6 valgus cut on the distal femur.  I then marked out the epicondylar axis on the distal femur.  The posterior condylar axis measured 3 degrees.  I then used the anterior referencing sizer and measured the femur to be a size F.  The 4-In-1 cutting block was screwed into place in external rotation matching the posterior condylar angle, making our cuts perpendicular to the epicondylar axis.  Anterior, posterior and chamfer cuts were made with the sagittal saw.  The cutting block and cut pieces were removed.  A lamina spreader was placed in 90 degrees of flexion.  The ACL, PCL, menisci, and posterior condylar osteophytes were removed.  A 12 mm spacer blocked was  found to offer good flexion and extension gap balance after minimal in degree releasing.   The scoop retractor was then placed and the femoral finishing block was pinned in place.  The small sagittal saw was used as well as the lug drill to finish the femur.  The block and cut surfaces were removed and the medullary canal hole filled with autograft bone from the cut pieces.  The tibia was delivered forward in deep flexion  and external rotation.  A size 5 tray was selected and pinned into place centered on the medial 1/3 of the tibial tubercle.  The reamer and keel was used to prepare the tibia through the tray.    I then trialed with the size F femur, size 5 tibia, a 12 mm insert and the 35 patella.  I had excellent flexion/extension gap balance, excellent patella tracking.  Flexion was full and beyond 120 degrees; extension was zero.  These components were chosen and the staff opened them to me on the back table while the knee was lavaged copiously and the cement mixed.  I cemented in the components and removed all excess cement.  The polyethylene tibial component was snapped into place and the knee placed in extension while cement was hardening.  The capsule was infilltrated with 20cc of .25% Marcaine with epinephrine.  A hemovac was place in the joint exiting superolaterally.  A pain pump was place superomedially superficial to the arthrotomy.  Once the cement was hard, the tourniquet was let down.  Hemostasis was obtained.  The arthrotomy was closed with figure-8 #1 vicryl sutures.  The deep soft tissues were closed with #0 vicryls and the subcuticular layer closed with a running #2-0 vicryl.  The skin was reapproximated and closed with skin staples.  The wound was dressed with xeroform, 4 x4's, 2 ABD sponges, a single layer of webril and a TED stocking.   The patient was then awakened, extubated, and taken to the recovery room in stable condition.  BLOOD LOSS:  300cc DRAINS: 1 hemovac, 1 pain catheter COMPLICATIONS:  None.  PLAN OF CARE: Admit to inpatient   PATIENT DISPOSITION:  PACU - hemodynamically stable.   Delay start of Pharmacological VTE agent (>24hrs) due to surgical blood loss or risk of bleeding:  not applicable  Please fax a copy of this op note to my office at [phone] (please only include page 1 and 2 of the Case Information op note)

## 2013-01-23 NOTE — Anesthesia Preprocedure Evaluation (Signed)
Anesthesia Evaluation  Patient identified by MRN, date of birth, ID band Patient awake    Reviewed: Allergy & Precautions, H&P , NPO status , Patient's Chart, lab work & pertinent test results  History of Anesthesia Complications (+) PONV  Airway       Dental   Pulmonary former smoker,          Cardiovascular hypertension,     Neuro/Psych    GI/Hepatic hiatal hernia, GERD-  ,  Endo/Other    Renal/GU      Musculoskeletal  (+) Arthritis -,   Abdominal   Peds  Hematology   Anesthesia Other Findings   Reproductive/Obstetrics                           Anesthesia Physical Anesthesia Plan  ASA: II  Anesthesia Plan: General   Post-op Pain Management: MAC Combined w/ Regional for Post-op pain   Induction: Intravenous  Airway Management Planned: LMA and Oral ETT  Additional Equipment:   Intra-op Plan:   Post-operative Plan: Extubation in OR  Informed Consent: I have reviewed the patients History and Physical, chart, labs and discussed the procedure including the risks, benefits and alternatives for the proposed anesthesia with the patient or authorized representative who has indicated his/her understanding and acceptance.     Plan Discussed with: CRNA, Anesthesiologist and Surgeon  Anesthesia Plan Comments:         Anesthesia Quick Evaluation

## 2013-01-23 NOTE — Progress Notes (Signed)
Care of pt assumed by MA Brendalee Matthies RN 

## 2013-01-24 LAB — BASIC METABOLIC PANEL
Calcium: 8.9 mg/dL (ref 8.4–10.5)
Chloride: 98 mEq/L (ref 96–112)
Creatinine, Ser: 0.75 mg/dL (ref 0.50–1.10)
GFR calc Af Amer: >90 mL/min (ref >90)
Sodium: 134 mEq/L — ABNORMAL LOW (ref 135–145)

## 2013-01-24 LAB — CBC
MCV: 87.1 fL (ref 78.0–100.0)
Platelets: 173 10*3/uL (ref 150–400)
RDW: 13.8 % (ref 11.5–15.5)
WBC: 9.1 10*3/uL (ref 4.0–10.5)

## 2013-01-24 MED ORDER — ENOXAPARIN SODIUM 40 MG/0.4ML ~~LOC~~ SOLN: 40.0000 mg | SUBCUTANEOUS | Status: DC

## 2013-01-24 MED ORDER — METHOCARBAMOL 500 MG PO TABS: 500.0000 mg | ORAL_TABLET | Freq: Four times a day (QID) | ORAL | Status: DC | PRN

## 2013-01-24 MED ORDER — OXYCODONE HCL 5 MG PO TABS: 5.0000 mg | ORAL_TABLET | ORAL | Status: DC | PRN

## 2013-01-24 MED ORDER — OXYCODONE HCL ER 10 MG PO T12A: 10.0000 mg | EXTENDED_RELEASE_TABLET | Freq: Two times a day (BID) | ORAL | Status: DC

## 2013-01-24 NOTE — Progress Notes (Signed)
SPORTS MEDICINE AND JOINT REPLACEMENT  Georgena Spurling, MD   Altamese Cabal, PA-C [address] [place], [place], [place]  [address]                             [phone]   PROGRESS NOTE  Subjective:  negative for Chest Pain  negative for Shortness of Breath  negative for Nausea/Vomiting   negative for Calf Pain  negative for Bowel Movement   Tolerating Diet: yes         Patient reports pain as 3 on 0-10 scale.    Objective: Vital signs in last 24 hours:   Patient Vitals for the past 24 hrs:  BP Temp Temp src Pulse Resp SpO2  01/24/13 1119 - - - - 16 99 %  01/24/13 0953 - - - 85 - -  01/24/13 0800 - - - - 18 97 %  01/24/13 0624 137/64 mmHg 98.4 F (36.9 C) Oral 82 16 100 %  01/24/13 0142 126/78 mmHg 98.7 F (37.1 C) Oral 80 16 95 %  01/24/13 0000 - - - - 16 95 %  01/23/13 2140 130/68 mmHg 98.7 F (37.1 C) Oral 96 16 97 %  01/23/13 2000 - - - - 16 95 %  01/23/13 1458 - - - 72 - -    @flow {1959:LAST@   Intake/Output from previous day:   05/05 0701 - 05/06 0700 In: 3410 [P.O.:410; I.V.:3000] Out: 2830 [Urine:2350; Drains:380]   Intake/Output this shift:   05/06 0701 - 05/06 1900 In: -  Out: 2 [Urine:1]   Intake/Output     05/05 0701 - 05/06 0700 05/06 0701 - 05/07 0700   P.O. 410    I.V. (mL/kg) 3000 (30.9)    Total Intake(mL/kg) 3410 (35.1)    Urine (mL/kg/hr) 2350 (1) 1 (0)   Drains 380 (0.2)    Stool  1 (0)   Blood 100 (0)    Total Output 2830 2   Net +580 -2           LABORATORY DATA:  Recent Labs  01/23/13 1426 01/24/13 0633  WBC 11.2* 9.1  HGB 12.1 12.1  HCT 35.4* 35.7*  PLT 174 173    Recent Labs  01/23/13 1426 01/24/13 0633  NA  --  134*  K  --  3.6  CL  --  98  CO2  --  27  BUN  --  10  CREATININE 0.77 0.75  GLUCOSE  --  104*  CALCIUM  --  8.9   Lab Results  Component Value Date   INR 1.05 01/10/2013    Examination:  General appearance: alert, cooperative and no distress Extremities: Homans sign is negative, no sign of  DVT  Wound Exam: clean, dry, intact   Drainage:  None: wound tissue dry  Motor Exam: EHL and FHL Intact  Sensory Exam: Deep Peroneal normal   Assessment:    1 Day Post-Op  Procedure(s) (LRB): TOTAL KNEE ARTHROPLASTY (Right)  ADDITIONAL DIAGNOSIS:  Active Problems:   * No active hospital problems. *  Acute Blood Loss Anemia   Plan: Physical Therapy as ordered Weight Bearing as Tolerated (WBAT)  DVT Prophylaxis:  Lovenox  DISCHARGE PLAN: Home  DISCHARGE NEEDS: HHPT, CPM, Walker and 3-in-1 comode seat         Elizabeth Singh 01/24/2013, 1:27 PM

## 2013-01-24 NOTE — Progress Notes (Signed)
Physical Therapy Treatment Patient Details Name: Elizabeth Singh MRN: [id number] DOB: 12/12/1950 Today's Date: 01/24/2013 Time: 4098-1191 PT Time Calculation (min): 43 min  PT Assessment / Plan / Recommendation Comments on Treatment Session  Pt able to ambulate this session however continues to be limited due to dizziness and overall knee pain.      Follow Up Recommendations  Home health PT;Supervision/Assistance - 24 hour     Barriers to Discharge  NONE      Equipment Recommendations  None recommended by PT    Recommendations for Other Services    Frequency 7X/week   Plan Discharge plan remains appropriate;Frequency remains appropriate    Precautions / Restrictions Precautions Precautions: Fall;Knee Precaution Booklet Issued: Yes (comment) Restrictions Weight Bearing Restrictions: Yes RLE Weight Bearing: Weight bearing as tolerated   Pertinent Vitals/Pain 8/10 right knee pain    Mobility  Bed Mobility Bed Mobility: Supine to Sit;Sit to Supine;Sitting - Scoot to Edge of Bed Supine to Sit: 4: Min assist;HOB elevated Sitting - Scoot to [place] of Bed: 4: Min guard Details for Bed Mobility Assistance: (A) with RLE OOB and cues for proper technique.   Transfers Transfers: Sit to Stand;Stand to Sit Sit to Stand: 4: Min assist;From bed;From elevated surface;With upper extremity assist Stand to Sit: 4: Min assist;With upper extremity assist;To toilet;To chair/3-in-1 Details for Transfer Assistance: (A) to initiate transfer with max cues for hand and LE placement.   Ambulation/Gait Ambulation/Gait Assistance: 4: Min assist Ambulation Distance (Feet): 20 Feet Assistive device: Rolling walker Ambulation/Gait Assistance Details: (A) to manage RW and cues for proper step sequence Gait Pattern: Step-to pattern;Decreased stride length;Decreased stance time - left;Antalgic Stairs: No Wheelchair Mobility Wheelchair Mobility: No    Exercises Total Joint Exercises Ankle Circles/Pumps:  Both;5 reps Quad Sets: Strengthening;AROM;Both;5 reps Heel Slides: Strengthening;Right;5 reps Goniometric ROM: 10-60   PT Diagnosis:    PT Problem List:   PT Treatment Interventions:     PT Goals Acute Rehab PT Goals PT Goal Formulation: With patient Time For Goal Achievement: 01/30/13 Potential to Achieve Goals: Good Pt will go Supine/Side to Sit: with modified independence PT Goal: Supine/Side to Sit - Progress: Progressing toward goal Pt will go Sit to Stand: with supervision PT Goal: Sit to Stand - Progress: Progressing toward goal Pt will go Stand to Sit: with modified independence PT Goal: Stand to Sit - Progress: Progressing toward goal Pt will Ambulate: >150 feet;with rolling walker;with modified independence PT Goal: Ambulate - Progress: Progressing toward goal  Visit Information  Last PT Received On: 01/24/13 Assistance Needed: +1    Subjective Data  Subjective: "I'm still a little dizzy probable from pain meds." Patient Stated Goal: to go home with daughter    Cognition  Cognition Arousal/Alertness: Awake/alert Behavior During Therapy: WFL for tasks assessed/performed Overall Cognitive Status: Within Functional Limits for tasks assessed    Balance  Balance Balance Assessed: Yes Static Sitting Balance Static Sitting - Balance Support: Feet supported Static Sitting - Level of Assistance: 5: Stand by assistance Static Standing Balance Static Standing - Balance Support: Bilateral upper extremity supported Static Standing - Level of Assistance: 4: Min assist  End of Session PT - End of Session Equipment Utilized During Treatment: Gait belt Activity Tolerance: Patient limited by pain Patient left: in chair;with call bell/phone within reach Nurse Communication: Mobility status CPM Right Knee CPM Right Knee: On   GP     Rhemi Balbach 01/24/2013, 11:00 AM Jake Shark, PT DPT [phone]

## 2013-01-24 NOTE — Progress Notes (Signed)
OT Cancellation Note  Patient Details Name: Tiki Tucciarone MRN: [id number] DOB: 1951/07/30   Cancelled Treatment:    Reason Eval/Treat Not Completed: Other (comment) (screen) Order received, reviewed chart and spoke with pt.  Pt politely declined and reported that she does not need OT and has no concerns prior to d/c.   Will sign off.      Earlie Raveling OTR/L [phone] 01/24/2013, 1:49 PM

## 2013-01-24 NOTE — Care Management Note (Signed)
CARE MANAGEMENT NOTE 01/24/2013  Patient:  Elizabeth Singh,Elizabeth Singh   Account Number:  [id number]  Date Initiated:  01/23/2013  Documentation initiated by:  [facility]  Subjective/Objective Assessment:   62 yr old female s/p right total knee arthroplasty.     Action/Plan:   CM spoke with patient concerning home health and DME needs at discharge.patient preoperatively setup with [facility], no changes. Rolling walker, 3in1 and CPM have been delivered to patient's home.   Anticipated DC Date:  01/25/2013   Anticipated DC Plan:  HOME W HOME HEALTH SERVICES      DC Planning Services  CM consult      [facility] Choice  HOME HEALTH   Choice offered to / List presented to:  C-1 Patient      DME agency  [employer]     HH arranged  HH-2 PT      [facility] agency  [facility]   Status of service:  Completed, signed off Medicare Important Message given?   (If response is "NO", the following Medicare IM given date fields will be blank) Date Medicare IM given:   Date Additional Medicare IM given:    Discharge Disposition:  HOME W HOME HEALTH SERVICES  Per UR Regulation:    If discussed at Long Length of Stay Meetings, dates discussed:    Comments:

## 2013-01-24 NOTE — Progress Notes (Signed)
Physical Therapy Progress Note   01/24/13 1300  PT Visit Information  Last PT Received On 01/24/13  Assistance Needed +1  PT Time Calculation  PT Start Time 1115  PT Stop Time 1158  PT Time Calculation (min) 43 min  Subjective Data  Subjective "I think its the meds making me dizzy."  Patient Stated Goal to go home with daughter   Precautions  Precautions Fall;Knee  Precaution Booklet Issued Yes (comment)  Restrictions  Weight Bearing Restrictions Yes  RLE Weight Bearing WBAT  Cognition  Arousal/Alertness Awake/alert  Behavior During Therapy WFL for tasks assessed/performed  Overall Cognitive Status Within Functional Limits for tasks assessed  Bed Mobility  Bed Mobility Not assessed  Transfers  Transfers Sit to Stand;Stand to Sit  Sit to Stand 4: Min assist;From elevated surface;With upper extremity assist;From toilet;From chair/3-in-1  Stand to Sit 4: Min assist;With upper extremity assist;To toilet;To chair/3-in-1  Details for Transfer Assistance (A) to initiate transfer with max cues for hand and LE placement.    Ambulation/Gait  Ambulation/Gait Assistance 4: Min assist  Ambulation Distance (Feet) 50 Feet  Assistive device Rolling walker  Ambulation/Gait Assistance Details (A) to maintain balance with cues for RW placement  Gait Pattern Step-to pattern;Decreased stride length;Decreased stance time - left;Antalgic  Stairs No  Engineering geologist No  Balance  Balance Assessed Yes  Static Standing Balance  Static Standing - Balance Support Bilateral upper extremity supported  Static Standing - Level of Assistance 4: Min assist ((A) needed while pt performing peri care with LOB)  Exercises  Exercises Total Joint  Total Joint Exercises  Heel Slides AAROM;5 reps;Right  PT - End of Session  Equipment Utilized During Treatment Gait belt  Activity Tolerance Patient limited by pain  Patient left in chair;with call bell/phone within reach  Nurse  Communication Mobility status  PT - Assessment/Plan  Comments on Treatment Session Pt continues to c/o pain & dizziness but able to increase ambulation distance.  PT Plan Discharge plan remains appropriate;Frequency remains appropriate  PT Frequency 7X/week  Follow Up Recommendations Home health PT;Supervision/Assistance - 24 hour  PT equipment None recommended by PT  Acute Rehab PT Goals  PT Goal Formulation With patient  Time For Goal Achievement 01/30/13  Potential to Achieve Goals Good  Pt will go Supine/Side to Sit with modified independence  PT Goal: Supine/Side to Sit - Progress Progressing toward goal  Pt will go Sit to Stand with supervision  PT Goal: Sit to Stand - Progress Progressing toward goal  Pt will go Stand to Sit with modified independence  PT Goal: Stand to Sit - Progress Progressing toward goal  Pt will Ambulate >150 feet;with rolling walker;with modified independence  PT Goal: Ambulate - Progress Progressing toward goal  PT General Charges  $$ ACUTE PT VISIT 1 Procedure  PT Treatments  $Gait Training 8-22 mins  $Therapeutic Exercise 8-22 mins  $Therapeutic Activity 8-22 mins   Pt reports 3/10 right knee pain. Premedicated.  [place], [place] DPT [phone]

## 2013-01-24 NOTE — Progress Notes (Signed)
Orthopedic Tech Progress Note Patient Details:  Elizabeth Singh 08/12/1951 [id number] Patient placed in CPM for 8am rounds.  CPM Right Knee CPM Right Knee: On Right Knee Flexion (Degrees): 90 Right Knee Extension (Degrees): 0   Asia R Thompson 01/24/2013, 9:23 AM

## 2013-01-25 ENCOUNTER — Encounter (HOSPITAL_COMMUNITY): Payer: Self-pay | Admitting: Orthopedic Surgery

## 2013-01-25 LAB — CBC
HCT: 33.7 % — ABNORMAL LOW (ref 36.0–46.0)
Platelets: 186 10*3/uL (ref 150–400)
RBC: 3.93 MIL/uL (ref 3.87–5.11)
RDW: 13.8 % (ref 11.5–15.5)
WBC: 11.4 10*3/uL — ABNORMAL HIGH (ref 4.0–10.5)

## 2013-01-25 LAB — BASIC METABOLIC PANEL
Chloride: 98 mEq/L (ref 96–112)
GFR calc Af Amer: 84 mL/min — ABNORMAL LOW (ref >90)
Potassium: 3.6 mEq/L (ref 3.5–5.1)

## 2013-01-25 MED ORDER — TRIAMTERENE-HCTZ 37.5-25 MG PO TABS
1.0000 | ORAL_TABLET | Freq: Every day | ORAL | Status: DC
  Administered 2013-01-25: 1 via ORAL
  Filled 2013-01-25: qty 1

## 2013-01-25 NOTE — Progress Notes (Signed)
Physical Therapy Treatment Patient Details Name: Elizabeth Singh MRN: [id number] DOB: 22-Aug-1951 Today's Date: 01/25/2013 Time: 5284-1324 PT Time Calculation (min): 25 min  PT Assessment / Plan / Recommendation Comments on Treatment Session  Pt moving well today. anticpate d/c today with daugther. No c/o dizziniess; able to increase ambulation. Recommend HHPT upon D/C    Follow Up Recommendations  Home health PT;Supervision/Assistance - 24 hour     Does the patient have the potential to tolerate intense rehabilitation     Barriers to Discharge        Equipment Recommendations  None recommended by PT    Recommendations for Other Services    Frequency 7X/week   Plan Discharge plan remains appropriate;Frequency remains appropriate    Precautions / Restrictions Precautions Precautions: Fall;Knee Precaution Booklet Issued: Yes (comment) Restrictions Weight Bearing Restrictions: Yes RLE Weight Bearing: Weight bearing as tolerated   Pertinent Vitals/Pain 4-5/10; pt premedicated; repositioned in bed.     Mobility  Bed Mobility Bed Mobility: Sit to Supine Sit to Supine: 5: Supervision;HOB flat;With rail Details for Bed Mobility Assistance: pt requires increased time due to pain; assists R LE with bil UEs, pt demo good technique  Transfers Transfers: Sit to Stand;Stand to Sit Stand to Sit: 6: Modified independent (Device/Increase time);To bed;To elevated surface Details for Transfer Assistance: pt required increased time; demo good technique  Ambulation/Gait Ambulation/Gait Assistance: 5: Supervision Ambulation Distance (Feet): 40 Feet Assistive device: Rolling walker Ambulation/Gait Assistance Details: no c/o dizziness today; required min cues for gt sequencing to increase step length on L LE and increase heel strike on R LE; correctable with cues  Gait Pattern: Step-to pattern;Decreased stride length;Decreased stance time - left;Antalgic Gait velocity: decreased  Stairs:  No Wheelchair Mobility Wheelchair Mobility: No    Exercises Total Joint Exercises Ankle Circles/Pumps: AROM;Both;10 reps;Supine Quad Sets: AROM;Right;10 reps;Supine Heel Slides: AAROM;10 reps;Seated;Right Hip ABduction/ADduction: AAROM;Right;10 reps;Supine Long Arc Quad: Right;AROM;10 reps;Seated Goniometric ROM: -8 ext to 80 flex   PT Diagnosis:    PT Problem List:   PT Treatment Interventions:     PT Goals Acute Rehab PT Goals PT Goal Formulation: With patient Time For Goal Achievement: 01/30/13 Potential to Achieve Goals: Good PT Goal: Sit to Stand - Progress: Progressing toward goal PT Goal: Stand to Sit - Progress: Progressing toward goal PT Goal: Ambulate - Progress: Progressing toward goal PT Goal: Perform Home Exercise Program - Progress: Progressing toward goal  Visit Information  Last PT Received On: 01/25/13 Assistance Needed: +1    Subjective Data  Subjective: Pt walking out of bathroom with daughter present; agreeable to therapy. "I am going home today." Patient Stated Goal: home today    Cognition  Cognition Arousal/Alertness: Awake/alert Behavior During Therapy: WFL for tasks assessed/performed Overall Cognitive Status: Within Functional Limits for tasks assessed    Balance     End of Session PT - End of Session Equipment Utilized During Treatment: Gait belt Activity Tolerance: Patient tolerated treatment well Patient left: in bed;with call bell/phone within reach Nurse Communication: Mobility status   GP     [name], [place] [phone] 01/25/2013, 1:11 PM

## 2013-01-25 NOTE — Progress Notes (Signed)
Pt discharged to home accompanied by daughter. Discharge instructions and rx given and explained and pt stated understanding. Pts IV was removed prior to discharge. Pt left unit in a stable condition via wheelchair.

## 2013-01-25 NOTE — Progress Notes (Signed)
SPORTS MEDICINE AND JOINT REPLACEMENT  Georgena Spurling, MD   Altamese Cabal, PA-C [address] [place], [place], [place]  [address]                             [phone]   PROGRESS NOTE  Subjective:  negative for Chest Pain  negative for Shortness of Breath  negative for Nausea/Vomiting   negative for Calf Pain  negative for Bowel Movement   Tolerating Diet: yes         Patient reports pain as 4 on 0-10 scale.    Objective: Vital signs in last 24 hours:   Patient Vitals for the past 24 hrs:  BP Temp Temp src Pulse Resp SpO2  01/25/13 0522 149/67 mmHg 98.6 F (37 C) Oral 84 16 94 %  01/25/13 0400 - - - - 16 94 %  01/25/13 0020 - 99.1 F (37.3 C) Oral - - -  01/25/13 0000 - - - - 16 94 %  01/24/13 2000 - - - - 18 96 %  01/24/13 1959 153/58 mmHg 100.6 F (38.1 C) Oral 79 18 96 %  01/24/13 1456 141/58 mmHg 98.7 F (37.1 C) Oral 81 18 96 %    @flow {1959:LAST@   Intake/Output from previous day:   05/06 0701 - 05/07 0700 In: 720 [P.O.:720] Out: 8 [Urine:7]   Intake/Output this shift:       Intake/Output     05/06 0701 - 05/07 0700 05/07 0701 - 05/08 0700   P.O. 720    I.V. (mL/kg)     Total Intake(mL/kg) 720 (7.4)    Urine (mL/kg/hr) 7 (0)    Drains     Stool 1 (0)    Blood     Total Output 8     Net +712             LABORATORY DATA:  Recent Labs  01/23/13 1426 01/24/13 0633 01/25/13 0545  WBC 11.2* 9.1 11.4*  HGB 12.1 12.1 11.7*  HCT 35.4* 35.7* 33.7*  PLT 174 173 186    Recent Labs  01/23/13 1426 01/24/13 0633 01/25/13 0545  NA  --  134* 133*  K  --  3.6 3.6  CL  --  98 98  CO2  --  27 27  BUN  --  10 8  CREATININE 0.77 0.75 0.84  GLUCOSE  --  104* 104*  CALCIUM  --  8.9 8.8   Lab Results  Component Value Date   INR 1.05 01/10/2013    Examination:  General appearance: alert, cooperative and no distress Extremities: Homans sign is negative, no sign of DVT  Wound Exam: clean, dry, intact   Drainage:  None: wound tissue  dry  Motor Exam: EHL and FHL Intact  Sensory Exam: Deep Peroneal normal   Assessment:    2 Days Post-Op  Procedure(s) (LRB): TOTAL KNEE ARTHROPLASTY (Right)  ADDITIONAL DIAGNOSIS:  Active Problems:   * No active hospital problems. *  Acute Blood Loss Anemia   Plan: Physical Therapy as ordered Weight Bearing as Tolerated (WBAT)  DVT Prophylaxis:  Lovenox  DISCHARGE PLAN: Home  DISCHARGE NEEDS: HHPT, CPM, Walker and 3-in-1 comode seat         [place] Strieter 01/25/2013, 2:39 PM

## 2013-01-25 NOTE — Discharge Summary (Signed)
SPORTS MEDICINE & JOINT REPLACEMENT   Elizabeth Spurling, MD   Elizabeth Cabal, PA-C [address] [place], [place], [place]  [address]                             [phone]  PATIENT ID: Elizabeth Singh        MRN:  [id number]          DOB/AGE: [birth year] / 62 y.o.    DISCHARGE SUMMARY  ADMISSION DATE:    01/23/2013 DISCHARGE DATE:   01/25/2013   ADMISSION DIAGNOSIS: osteoarthritis right knee    DISCHARGE DIAGNOSIS:  osteoarthritis right knee    ADDITIONAL DIAGNOSIS: Active Problems:   * No active hospital problems. *  Past Medical History  Diagnosis Date  . PONV (postoperative nausea and vomiting)     once post surgery  . Hypertension   . High cholesterol   . Arthritis   . Joint pain   . GERD (gastroesophageal reflux disease)   . H/O hiatal hernia   . Thyroid disorder   . Glaucoma     PROCEDURE: Procedure(s): TOTAL KNEE ARTHROPLASTY on 01/23/2013  CONSULTS:     HISTORY:  See H&P in chart  HOSPITAL COURSE:  Elizabeth Singh is a 62 y.o. admitted on 01/23/2013 and found to have a diagnosis of osteoarthritis right knee.  After appropriate laboratory studies were obtained  they were taken to the operating room on 01/23/2013 and underwent Procedure(s): TOTAL KNEE ARTHROPLASTY.   They were given perioperative antibiotics:  Anti-infectives   Start     Dose/Rate Route Frequency Ordered Stop   01/23/13 1430  ceFAZolin (ANCEF) IVPB 2 g/50 mL premix     2 g 100 mL/hr over 30 Minutes Intravenous Every 6 hours 01/23/13 1304 01/23/13 2031   01/23/13 0600  ceFAZolin (ANCEF) 3 g in dextrose 5 % 50 mL IVPB     3 g 160 mL/hr over 30 Minutes Intravenous On call to O.R. 01/22/13 1339 01/23/13 0830    .  Tolerated the procedure well.  Placed with a foley intraoperatively.  Given Ofirmev at induction and for 48 hours.    POD# 1: Vital signs were stable.  Patient denied Chest pain, shortness of breath, or calf pain.  Patient was started on Lovenox 30 mg subcutaneously twice daily at 8am.   Consults to PT, OT, and care management were made.  The patient was weight bearing as tolerated.  CPM was placed on the operative leg 0-90 degrees for 6-8 hours a day.  Incentive spirometry was taught.  Dressing was changed.  Marcaine pump and hemovac were discontinued.      POD #2, Continued  PT for ambulation and exercise program.  IV saline locked.  O2 discontinued.    The remainder of the hospital course was dedicated to ambulation and strengthening.   The patient was discharged on 2 Days Post-Op in  Good condition.  Blood products given:none  DIAGNOSTIC STUDIES: Recent vital signs: Patient Vitals for the past 24 hrs:  BP Temp Temp src Pulse Resp SpO2  01/25/13 0522 149/67 mmHg 98.6 F (37 C) Oral 84 16 94 %  01/25/13 0400 - - - - 16 94 %  01/25/13 0020 - 99.1 F (37.3 C) Oral - - -  01/25/13 0000 - - - - 16 94 %  01/24/13 2000 - - - - 18 96 %  01/24/13 1959 153/58 mmHg 100.6 F (38.1 C) Oral 79 18 96 %  01/24/13 1456 141/58 mmHg 98.7 F (37.1 C) Oral 81 18 96 %       Recent laboratory studies:  Recent Labs  01/23/13 1426 01/24/13 0633 01/25/13 0545  WBC 11.2* 9.1 11.4*  HGB 12.1 12.1 11.7*  HCT 35.4* 35.7* 33.7*  PLT 174 173 186    Recent Labs  01/23/13 1426 01/24/13 0633 01/25/13 0545  NA  --  134* 133*  K  --  3.6 3.6  CL  --  98 98  CO2  --  27 27  BUN  --  10 8  CREATININE 0.77 0.75 0.84  GLUCOSE  --  104* 104*  CALCIUM  --  8.9 8.8   Lab Results  Component Value Date   INR 1.05 01/10/2013     Recent Radiographic Studies :  Dg Chest 2 View  01/10/2013  *RADIOLOGY REPORT*  Clinical Data: Preoperative evaluation for knee replacement, history hypertension  CHEST - 2 VIEW  Comparison: None  Findings: Normal heart size and pulmonary vascularity. Tortuous aorta. Minimal peribronchial thickening. Prominent vascular marking in right upper lobe. No acute infiltrate, pleural effusion or pneumothorax. Bones unremarkable.  IMPRESSION: Minimal bronchitic  changes.   Original Report Authenticated By: Ulyses Southward, M.D.     DISCHARGE INSTRUCTIONS: Discharge Orders   Future Orders Complete By Expires     CPM  As directed     Comments:      Continuous passive motion machine (CPM):      Use the CPM from 0 to 90 for 6-8 hours per day.      You may increase by 10 per day.  You may break it up into 2 or 3 sessions per day.      Use CPM for 2 weeks or until you are told to stop.    Call MD / Call 911  As directed     Comments:      If you experience chest pain or shortness of breath, CALL 911 and be transported to the hospital emergency room.  If you develope a fever above 101 F, pus (white drainage) or increased drainage or redness at the wound, or calf pain, call your surgeon's office.    Change dressing  As directed     Comments:      Change dressing on thursday, then change the dressing daily with sterile 4 x 4 inch gauze dressing and apply TED hose.    Constipation Prevention  As directed     Comments:      Drink plenty of fluids.  Prune juice may be helpful.  You may use a stool softener, such as Colace (over the counter) 100 mg twice a day.  Use MiraLax (over the counter) for constipation as needed.    Diet - low sodium heart healthy  As directed     Do not put a pillow under the knee. Place it under the heel.  As directed     Driving restrictions  As directed     Comments:      No driving for 6 weeks    Increase activity slowly as tolerated  As directed     Lifting restrictions  As directed     Comments:      No lifting for 6 weeks    TED hose  As directed     Comments:      Use stockings (TED hose) for 3 weeks on both leg(s).  You may remove them at night for sleeping.  DISCHARGE MEDICATIONS:     Medication List    STOP taking these medications       traMADol 50 MG tablet  Commonly known as:  ULTRAM      TAKE these medications       diflunisal 500 MG Tabs  Commonly known as:  DOLOBID  Take 500 mg by mouth 2 (two)  times daily.     enoxaparin 40 MG/0.4ML injection  Commonly known as:  LOVENOX  Inject 0.4 mLs (40 mg total) into the skin daily.     KRILL OIL PO  Take 1 tablet by mouth daily.     latanoprost 0.005 % ophthalmic solution  Commonly known as:  XALATAN  Place 1 drop into both eyes at bedtime.     methocarbamol 500 MG tablet  Commonly known as:  ROBAXIN  Take 1 tablet (500 mg total) by mouth every 6 (six) hours as needed.     metoprolol 50 MG tablet  Commonly known as:  LOPRESSOR  Take 50 mg by mouth 2 (two) times daily.     oxyCODONE 5 MG immediate release tablet  Commonly known as:  Oxy IR/ROXICODONE  Take 1-2 tablets (5-10 mg total) by mouth every 4 (four) hours as needed.     OxyCODONE 10 mg T12a  Commonly known as:  OXYCONTIN  Take 1 tablet (10 mg total) by mouth every 12 (twelve) hours.     potassium chloride SA 20 MEQ tablet  Commonly known as:  K-DUR,KLOR-CON  Take 20 mEq by mouth every morning.      ASK your doctor about these medications       amiloride-hydrochlorothiazide 5-50 MG tablet  Commonly known as:  MODURETIC  Take 0.5 tablets by mouth daily.        FOLLOW UP VISIT:       Follow-up Information   Follow up with Raymon Mutton, MD. Call on 02/07/2013.   Contact information:   [address] [place] [address] [phone]       DISPOSITION: HOME   CONDITION:  Good   Durelle Zepeda 01/25/2013, 2:42 PM

## 2013-02-13 NOTE — Progress Notes (Signed)
PT is recommending home with HH and not SNF.  Clinical Social Worker will sign off for now as social work intervention is no longer needed. Please consult us again if new need arises.   Brandilynn Taormina, MSW [phone] 

## 2013-02-14 ENCOUNTER — Ambulatory Visit: Payer: Managed Care, Other (non HMO) | Attending: Orthopedic Surgery | Admitting: Rehabilitation

## 2013-02-14 DIAGNOSIS — R609 Edema, unspecified: Secondary | ICD-10-CM | POA: Insufficient documentation

## 2013-02-14 DIAGNOSIS — M25669 Stiffness of unspecified knee, not elsewhere classified: Secondary | ICD-10-CM | POA: Insufficient documentation

## 2013-02-14 DIAGNOSIS — IMO0001 Reserved for inherently not codable concepts without codable children: Secondary | ICD-10-CM | POA: Insufficient documentation

## 2013-02-14 DIAGNOSIS — Z96659 Presence of unspecified artificial knee joint: Secondary | ICD-10-CM | POA: Insufficient documentation

## 2013-02-14 DIAGNOSIS — M25569 Pain in unspecified knee: Secondary | ICD-10-CM | POA: Insufficient documentation

## 2013-02-16 ENCOUNTER — Ambulatory Visit: Payer: Managed Care, Other (non HMO) | Admitting: Rehabilitation

## 2013-02-20 ENCOUNTER — Ambulatory Visit: Payer: Managed Care, Other (non HMO) | Attending: Orthopedic Surgery | Admitting: Rehabilitation

## 2013-02-20 DIAGNOSIS — Z96659 Presence of unspecified artificial knee joint: Secondary | ICD-10-CM | POA: Insufficient documentation

## 2013-02-20 DIAGNOSIS — M25569 Pain in unspecified knee: Secondary | ICD-10-CM | POA: Insufficient documentation

## 2013-02-20 DIAGNOSIS — R609 Edema, unspecified: Secondary | ICD-10-CM | POA: Insufficient documentation

## 2013-02-20 DIAGNOSIS — M25669 Stiffness of unspecified knee, not elsewhere classified: Secondary | ICD-10-CM | POA: Insufficient documentation

## 2013-02-20 DIAGNOSIS — IMO0001 Reserved for inherently not codable concepts without codable children: Secondary | ICD-10-CM | POA: Insufficient documentation

## 2013-02-23 ENCOUNTER — Ambulatory Visit: Payer: Managed Care, Other (non HMO) | Admitting: Rehabilitation

## 2013-02-27 ENCOUNTER — Ambulatory Visit: Payer: Managed Care, Other (non HMO) | Admitting: Rehabilitation

## 2013-03-02 ENCOUNTER — Ambulatory Visit: Payer: Managed Care, Other (non HMO) | Admitting: Rehabilitation

## 2013-03-06 ENCOUNTER — Ambulatory Visit: Payer: Managed Care, Other (non HMO) | Admitting: Rehabilitation

## 2013-03-09 ENCOUNTER — Ambulatory Visit: Payer: Managed Care, Other (non HMO) | Admitting: Rehabilitation

## 2013-03-13 ENCOUNTER — Ambulatory Visit: Payer: Managed Care, Other (non HMO) | Admitting: Rehabilitation

## 2013-03-16 ENCOUNTER — Ambulatory Visit: Payer: Managed Care, Other (non HMO) | Admitting: Rehabilitation

## 2013-03-20 ENCOUNTER — Ambulatory Visit: Payer: Managed Care, Other (non HMO) | Admitting: Rehabilitation

## 2013-03-22 ENCOUNTER — Ambulatory Visit: Payer: Managed Care, Other (non HMO) | Attending: Orthopedic Surgery | Admitting: Rehabilitation

## 2013-03-22 DIAGNOSIS — Z96659 Presence of unspecified artificial knee joint: Secondary | ICD-10-CM | POA: Insufficient documentation

## 2013-03-22 DIAGNOSIS — M25569 Pain in unspecified knee: Secondary | ICD-10-CM | POA: Insufficient documentation

## 2013-03-22 DIAGNOSIS — M25669 Stiffness of unspecified knee, not elsewhere classified: Secondary | ICD-10-CM | POA: Insufficient documentation

## 2013-03-22 DIAGNOSIS — IMO0001 Reserved for inherently not codable concepts without codable children: Secondary | ICD-10-CM | POA: Insufficient documentation

## 2013-03-22 DIAGNOSIS — R609 Edema, unspecified: Secondary | ICD-10-CM | POA: Insufficient documentation

## 2013-03-27 ENCOUNTER — Ambulatory Visit: Payer: Managed Care, Other (non HMO) | Admitting: Rehabilitation

## 2013-03-30 ENCOUNTER — Ambulatory Visit: Payer: Managed Care, Other (non HMO) | Admitting: Rehabilitation

## 2013-04-03 ENCOUNTER — Ambulatory Visit: Payer: Managed Care, Other (non HMO) | Admitting: Rehabilitation

## 2013-04-06 ENCOUNTER — Ambulatory Visit: Payer: Managed Care, Other (non HMO) | Admitting: Rehabilitation

## 2013-04-07 ENCOUNTER — Ambulatory Visit: Payer: Managed Care, Other (non HMO) | Admitting: Rehabilitation

## 2013-04-10 ENCOUNTER — Ambulatory Visit: Payer: Managed Care, Other (non HMO) | Admitting: Rehabilitation

## 2013-04-13 ENCOUNTER — Ambulatory Visit: Payer: Managed Care, Other (non HMO) | Admitting: Rehabilitation

## 2013-04-17 ENCOUNTER — Ambulatory Visit: Payer: Managed Care, Other (non HMO) | Admitting: Rehabilitation

## 2013-04-20 ENCOUNTER — Ambulatory Visit: Payer: Managed Care, Other (non HMO) | Admitting: Rehabilitation

## 2013-04-24 ENCOUNTER — Ambulatory Visit: Payer: Managed Care, Other (non HMO) | Attending: Orthopedic Surgery | Admitting: Rehabilitation

## 2013-04-24 DIAGNOSIS — M25569 Pain in unspecified knee: Secondary | ICD-10-CM | POA: Insufficient documentation

## 2013-04-24 DIAGNOSIS — R609 Edema, unspecified: Secondary | ICD-10-CM | POA: Insufficient documentation

## 2013-04-24 DIAGNOSIS — IMO0001 Reserved for inherently not codable concepts without codable children: Secondary | ICD-10-CM | POA: Insufficient documentation

## 2013-04-24 DIAGNOSIS — Z96659 Presence of unspecified artificial knee joint: Secondary | ICD-10-CM | POA: Insufficient documentation

## 2013-04-24 DIAGNOSIS — M25669 Stiffness of unspecified knee, not elsewhere classified: Secondary | ICD-10-CM | POA: Insufficient documentation

## 2013-04-27 ENCOUNTER — Ambulatory Visit: Payer: Managed Care, Other (non HMO) | Admitting: Rehabilitation

## 2013-05-01 ENCOUNTER — Ambulatory Visit: Payer: Managed Care, Other (non HMO) | Admitting: Rehabilitation

## 2013-05-03 ENCOUNTER — Ambulatory Visit: Payer: Managed Care, Other (non HMO) | Admitting: Rehabilitation

## 2014-09-04 ENCOUNTER — Ambulatory Visit: Payer: Managed Care, Other (non HMO) | Admitting: Rehabilitation

## 2015-08-13 ENCOUNTER — Other Ambulatory Visit: Payer: Self-pay | Admitting: Orthopedic Surgery

## 2015-08-28 ENCOUNTER — Other Ambulatory Visit: Payer: Self-pay | Admitting: Orthopedic Surgery

## 2015-09-13 ENCOUNTER — Encounter (HOSPITAL_COMMUNITY)
Admission: RE | Admit: 2015-09-13 | Discharge: 2015-09-13 | Disposition: A | Discharge status: Home / Self Care | Payer: Medicare HMO | Source: Ambulatory Visit | Attending: Orthopedic Surgery | Admitting: Orthopedic Surgery

## 2015-09-13 ENCOUNTER — Other Ambulatory Visit (HOSPITAL_COMMUNITY): Payer: Self-pay | Admitting: *Deleted

## 2015-09-13 ENCOUNTER — Encounter (HOSPITAL_COMMUNITY): Payer: Self-pay

## 2015-09-13 DIAGNOSIS — Z01812 Encounter for preprocedural laboratory examination: Secondary | ICD-10-CM | POA: Insufficient documentation

## 2015-09-13 DIAGNOSIS — Z01818 Encounter for other preprocedural examination: Secondary | ICD-10-CM | POA: Insufficient documentation

## 2015-09-13 DIAGNOSIS — M1711 Unilateral primary osteoarthritis, right knee: Secondary | ICD-10-CM | POA: Insufficient documentation

## 2015-09-13 HISTORY — DX: Personal history of urinary (tract) infections: Z87.440

## 2015-09-13 HISTORY — DX: Diverticulosis of intestine, part unspecified, without perforation or abscess without bleeding: K57.90

## 2015-09-13 HISTORY — DX: Headache, unspecified: R51.9

## 2015-09-13 HISTORY — DX: Headache: R51

## 2015-09-13 HISTORY — DX: Ventricular premature depolarization: I49.3

## 2015-09-13 HISTORY — DX: Fatty (change of) liver, not elsewhere classified: K76.0

## 2015-09-13 LAB — COMPREHENSIVE METABOLIC PANEL
ALBUMIN: 3.8 g/dL (ref 3.5–5.0)
ALK PHOS: 96 U/L (ref 38–126)
ALT: 12 U/L — AB (ref 14–54)
AST: 21 U/L (ref 15–41)
Anion gap: 6 (ref 5–15)
BUN: 15 mg/dL (ref 6–20)
CALCIUM: 9.6 mg/dL (ref 8.9–10.3)
CO2: 26 mmol/L (ref 22–32)
CREATININE: 0.85 mg/dL (ref 0.44–1.00)
Chloride: 105 mmol/L (ref 101–111)
GFR calc Af Amer: >60 mL/min (ref >60)
GFR calc non Af Amer: >60 mL/min (ref >60)
GLUCOSE: 100 mg/dL — AB (ref 65–99)
Potassium: 4.2 mmol/L (ref 3.5–5.1)
SODIUM: 137 mmol/L (ref 135–145)
Total Bilirubin: 0.5 mg/dL (ref 0.3–1.2)
Total Protein: 8.3 g/dL — ABNORMAL HIGH (ref 6.5–8.1)

## 2015-09-13 LAB — CBC WITH DIFFERENTIAL/PLATELET
BASOS ABS: 0 10*3/uL (ref 0.0–0.1)
Basophils Relative: 1 %
EOS PCT: 4 %
Eosinophils Absolute: 0.2 10*3/uL (ref 0.0–0.7)
HEMATOCRIT: 40.9 % (ref 36.0–46.0)
Hemoglobin: 13.1 g/dL (ref 12.0–15.0)
LYMPHS PCT: 33 %
Lymphs Abs: 2 10*3/uL (ref 0.7–4.0)
MCH: 29.6 pg (ref 26.0–34.0)
MCHC: 32 g/dL (ref 30.0–36.0)
MCV: 92.3 fL (ref 78.0–100.0)
MONO ABS: 0.5 10*3/uL (ref 0.1–1.0)
MONOS PCT: 7 %
NEUTROS ABS: 3.5 10*3/uL (ref 1.7–7.7)
Neutrophils Relative %: 55 %
PLATELETS: 208 10*3/uL (ref 150–400)
RBC: 4.43 MIL/uL (ref 3.87–5.11)
RDW: 14.4 % (ref 11.5–15.5)
WBC: 6.3 10*3/uL (ref 4.0–10.5)

## 2015-09-13 LAB — URINALYSIS, ROUTINE W REFLEX MICROSCOPIC
Bilirubin Urine: NEGATIVE
GLUCOSE, UA: NEGATIVE mg/dL
HGB URINE DIPSTICK: NEGATIVE
Ketones, ur: NEGATIVE mg/dL
Leukocytes, UA: NEGATIVE
Nitrite: NEGATIVE
PROTEIN: NEGATIVE mg/dL
SPECIFIC GRAVITY, URINE: 1.017 (ref 1.005–1.030)
pH: 6 (ref 5.0–8.0)

## 2015-09-13 LAB — SURGICAL PCR SCREEN
MRSA, PCR: NEGATIVE
Staphylococcus aureus: NEGATIVE

## 2015-09-13 LAB — PROTIME-INR
INR: 1.15 (ref 0.00–1.49)
Prothrombin Time: 14.9 seconds (ref 11.6–15.2)

## 2015-09-13 LAB — APTT: APTT: 35 s (ref 24–37)

## 2015-09-13 NOTE — Pre-Procedure Instructions (Signed)
Elizabeth Singh  09/13/2015      Your procedure is scheduled on Monday, September 23, 2015 at 10:00 AM.   Report to [facility] Entrance "A" Admitting Office at 8:00 AM.   Call this number if you have problems the morning of surgery: [phone]   Any questions prior to day of surgery, please call [phone] between 8 & 4 PM.   Remember:  Do not eat food or drink liquids after midnight Sunday, 09/22/15.  Take these medicines the morning of surgery with A SIP OF WATER: Metoprolol (Lopressor)  Stop Aspirin 7 days prior to surgery.   Do not wear jewelry, make-up or nail polish.  Do not wear lotions, powders, or perfumes.  You may wear deodorant.  Do not shave 48 hours prior to surgery.    Do not bring valuables to the hospital.  [facility] is not responsible for any belongings or valuables.  Contacts, dentures or bridgework may not be worn into surgery.  Leave your suitcase in the car.  After surgery it may be brought to your room.  For patients admitted to the hospital, discharge time will be determined by your treatment team.  Special instructions:  [place] - Preparing for Surgery  Before surgery, you can play an important role.  Because skin is not sterile, your skin needs to be as free of germs as possible.  You can reduce the number of germs on you skin by washing with CHG (chlorahexidine gluconate) soap before surgery.  CHG is an antiseptic cleaner which kills germs and bonds with the skin to continue killing germs even after washing.  Please DO NOT use if you have an allergy to CHG or antibacterial soaps.  If your skin becomes reddened/irritated stop using the CHG and inform your nurse when you arrive at Short Stay.  Do not shave (including legs and underarms) for at least 48 hours prior to the first CHG shower.  You may shave your face.  Please follow these instructions carefully:   1.  Shower with CHG Soap the night before surgery and the                                 morning of Surgery.  2.  If you choose to wash your hair, wash your hair first as usual with your       normal shampoo.  3.  After you shampoo, rinse your hair and body thoroughly to remove the                      Shampoo.  4.  Use CHG as you would any other liquid soap.  You can apply chg directly       to the skin and wash gently with scrungie or a clean washcloth.  5.  Apply the CHG Soap to your body ONLY FROM THE NECK DOWN.        Do not use on open wounds or open sores.  Avoid contact with your eyes, ears, mouth and genitals (private parts).  Wash genitals (private parts) with your normal soap.  6.  Wash thoroughly, paying special attention to the area where your surgery        will be performed.  7.  Thoroughly rinse your body with warm water from the neck down.  8.  DO NOT shower/wash with your normal soap after using and rinsing off  the CHG Soap.  9.  Pat yourself dry with a clean towel.            10.  Wear clean pajamas.            11.  Place clean sheets on your bed the night of your first shower and do not        sleep with pets.  Day of Surgery  Do not apply any lotions the morning of surgery.  Please wear clean clothes to the hospital.   Please read over the following fact sheets that you were given. Pain Booklet, Coughing and Deep Breathing, MRSA Information and Surgical Site Infection Prevention

## 2015-09-13 NOTE — Progress Notes (Signed)
Pt states she had PVC's in 2002 and had a cath which was normal, found to have low potassium and they went away once treated. Denies any recent chest pain or sob.

## 2015-09-14 LAB — URINE CULTURE

## 2015-09-19 ENCOUNTER — Other Ambulatory Visit (HOSPITAL_COMMUNITY): Payer: Managed Care, Other (non HMO)

## 2015-09-20 MED ORDER — CHLORHEXIDINE GLUCONATE 4 % EX LIQD: 60.0000 mL | Freq: Once | CUTANEOUS | Status: DC

## 2015-09-20 MED ORDER — BUPIVACAINE LIPOSOME 1.3 % IJ SUSP
20.0000 mL | Freq: Once | INTRAMUSCULAR | Status: AC
Start: 2015-09-23 — End: 2015-09-23
  Administered 2015-09-23: 20 mL
  Filled 2015-09-20: qty 20

## 2015-09-20 MED ORDER — SODIUM CHLORIDE 0.9 % IV SOLN: INTRAVENOUS | Status: DC

## 2015-09-20 MED ORDER — TRANEXAMIC ACID 1000 MG/10ML IV SOLN
1000.0000 mg | INTRAVENOUS | Status: AC
  Administered 2015-09-23: 1000 mg via INTRAVENOUS
  Filled 2015-09-20: qty 10

## 2015-09-20 MED ORDER — CEFAZOLIN SODIUM-DEXTROSE 2-3 GM-% IV SOLR
2.0000 g | INTRAVENOUS | Status: AC
Start: 2015-09-23 — End: 2015-09-23
  Administered 2015-09-23: 2 g via INTRAVENOUS
  Filled 2015-09-20: qty 50

## 2015-09-22 NOTE — Anesthesia Preprocedure Evaluation (Addendum)
Anesthesia Evaluation  Patient identified by MRN, date of birth, ID band Patient awake    Reviewed: Allergy & Precautions, H&P , NPO status , Patient's Chart, lab work & pertinent test results  History of Anesthesia Complications (+) PONV and history of anesthetic complications  Airway Mallampati: II  TM Distance: >3 FB Neck ROM: Full    Dental no notable dental hx.    Pulmonary former smoker,    Pulmonary exam normal breath sounds clear to auscultation       Cardiovascular hypertension, Normal cardiovascular exam Rhythm:Regular Rate:Normal     Neuro/Psych    GI/Hepatic hiatal hernia, GERD  ,  Endo/Other    Renal/GU      Musculoskeletal  (+) Arthritis ,   Abdominal   Peds  Hematology   Anesthesia Other Findings   Reproductive/Obstetrics                            Anesthesia Physical  Anesthesia Plan  ASA: II  Anesthesia Plan: Spinal   Post-op Pain Management:    Induction: Intravenous  Airway Management Planned: Simple Face Mask  Additional Equipment:   Intra-op Plan:   Post-operative Plan:   Informed Consent: I have reviewed the patients History and Physical, chart, labs and discussed the procedure including the risks, benefits and alternatives for the proposed anesthesia with the patient or authorized representative who has indicated his/her understanding and acceptance.     Plan Discussed with: CRNA, Anesthesiologist and Surgeon  Anesthesia Plan Comments: (Spinal + exparel)        Anesthesia Quick Evaluation

## 2015-09-23 ENCOUNTER — Inpatient Hospital Stay (HOSPITAL_COMMUNITY): Payer: Medicare HMO | Admitting: Anesthesiology

## 2015-09-23 ENCOUNTER — Inpatient Hospital Stay (HOSPITAL_COMMUNITY)
Admission: RE | Admit: 2015-09-23 | Discharge: 2015-09-26 | DRG: 470 | Disposition: A | Discharge status: Home Health | Payer: Medicare HMO | Source: Ambulatory Visit | Attending: Orthopedic Surgery | Admitting: Orthopedic Surgery

## 2015-09-23 ENCOUNTER — Encounter (HOSPITAL_COMMUNITY)
Admission: RE | Disposition: A | Discharge status: Home Health | Payer: Self-pay | Source: Ambulatory Visit | Attending: Orthopedic Surgery

## 2015-09-23 ENCOUNTER — Encounter (HOSPITAL_COMMUNITY): Payer: Self-pay | Admitting: Certified Registered Nurse Anesthetist

## 2015-09-23 DIAGNOSIS — M25562 Pain in left knee: Secondary | ICD-10-CM | POA: Diagnosis present

## 2015-09-23 DIAGNOSIS — I10 Essential (primary) hypertension: Secondary | ICD-10-CM | POA: Diagnosis present

## 2015-09-23 DIAGNOSIS — K760 Fatty (change of) liver, not elsewhere classified: Secondary | ICD-10-CM | POA: Diagnosis present

## 2015-09-23 DIAGNOSIS — Z87891 Personal history of nicotine dependence: Secondary | ICD-10-CM

## 2015-09-23 DIAGNOSIS — Z9049 Acquired absence of other specified parts of digestive tract: Secondary | ICD-10-CM

## 2015-09-23 DIAGNOSIS — Z96651 Presence of right artificial knee joint: Secondary | ICD-10-CM | POA: Diagnosis present

## 2015-09-23 DIAGNOSIS — E7800 Pure hypercholesterolemia, unspecified: Secondary | ICD-10-CM | POA: Diagnosis present

## 2015-09-23 DIAGNOSIS — H409 Unspecified glaucoma: Secondary | ICD-10-CM | POA: Diagnosis present

## 2015-09-23 DIAGNOSIS — M1712 Unilateral primary osteoarthritis, left knee: Secondary | ICD-10-CM | POA: Diagnosis present

## 2015-09-23 DIAGNOSIS — D62 Acute posthemorrhagic anemia: Secondary | ICD-10-CM | POA: Diagnosis not present

## 2015-09-23 DIAGNOSIS — Z8249 Family history of ischemic heart disease and other diseases of the circulatory system: Secondary | ICD-10-CM

## 2015-09-23 DIAGNOSIS — Z881 Allergy status to other antibiotic agents status: Secondary | ICD-10-CM | POA: Diagnosis not present

## 2015-09-23 DIAGNOSIS — K449 Diaphragmatic hernia without obstruction or gangrene: Secondary | ICD-10-CM | POA: Diagnosis present

## 2015-09-23 DIAGNOSIS — Z96659 Presence of unspecified artificial knee joint: Secondary | ICD-10-CM

## 2015-09-23 DIAGNOSIS — K219 Gastro-esophageal reflux disease without esophagitis: Secondary | ICD-10-CM | POA: Diagnosis present

## 2015-09-23 HISTORY — PX: TOTAL KNEE ARTHROPLASTY: SHX125

## 2015-09-23 LAB — CREATININE, SERUM
Creatinine, Ser: 0.84 mg/dL (ref 0.44–1.00)
GFR calc Af Amer: >60 mL/min (ref >60)

## 2015-09-23 LAB — CBC
HEMATOCRIT: 38.5 % (ref 36.0–46.0)
Hemoglobin: 12.5 g/dL (ref 12.0–15.0)
MCH: 29.2 pg (ref 26.0–34.0)
MCHC: 32.5 g/dL (ref 30.0–36.0)
MCV: 90 fL (ref 78.0–100.0)
Platelets: 200 10*3/uL (ref 150–400)
RBC: 4.28 MIL/uL (ref 3.87–5.11)
RDW: 14 % (ref 11.5–15.5)
WBC: 8 10*3/uL (ref 4.0–10.5)

## 2015-09-23 SURGERY — ARTHROPLASTY, KNEE, TOTAL
Anesthesia: General | Site: Knee | Laterality: Left

## 2015-09-23 MED ORDER — METHOCARBAMOL 500 MG PO TABS
500.0000 mg | ORAL_TABLET | Freq: Four times a day (QID) | ORAL | Status: DC | PRN
  Administered 2015-09-24 – 2015-09-26 (×4): 500 mg via ORAL
  Filled 2015-09-23 (×4): qty 1

## 2015-09-23 MED ORDER — ACETAMINOPHEN 650 MG RE SUPP: 650.0000 mg | Freq: Four times a day (QID) | RECTAL | Status: DC | PRN

## 2015-09-23 MED ORDER — PHENYLEPHRINE 40 MCG/ML (10ML) SYRINGE FOR IV PUSH (FOR BLOOD PRESSURE SUPPORT)
PREFILLED_SYRINGE | INTRAVENOUS | Status: AC
  Filled 2015-09-23: qty 20

## 2015-09-23 MED ORDER — BUPIVACAINE HCL (PF) 0.5 % IJ SOLN
INTRAMUSCULAR | Status: DC | PRN
  Administered 2015-09-23: 2.5 mL via INTRATHECAL

## 2015-09-23 MED ORDER — BUPIVACAINE-EPINEPHRINE (PF) 0.5% -1:200000 IJ SOLN
INTRAMUSCULAR | Status: AC
  Filled 2015-09-23: qty 30

## 2015-09-23 MED ORDER — OXYCODONE HCL ER 10 MG PO T12A
20.0000 mg | EXTENDED_RELEASE_TABLET | Freq: Two times a day (BID) | ORAL | Status: DC
  Administered 2015-09-23 – 2015-09-24 (×3): 20 mg via ORAL
  Filled 2015-09-23 (×4): qty 2

## 2015-09-23 MED ORDER — FLEET ENEMA 7-19 GM/118ML RE ENEM: 1.0000 | ENEMA | Freq: Once | RECTAL | Status: DC | PRN

## 2015-09-23 MED ORDER — DEXAMETHASONE SODIUM PHOSPHATE 4 MG/ML IJ SOLN
INTRAMUSCULAR | Status: DC | PRN
  Administered 2015-09-23: 4 mg via INTRAVENOUS

## 2015-09-23 MED ORDER — CEFAZOLIN SODIUM-DEXTROSE 2-3 GM-% IV SOLR
2.0000 g | Freq: Four times a day (QID) | INTRAVENOUS | Status: AC
  Administered 2015-09-23: 2 g via INTRAVENOUS
  Filled 2015-09-23 (×3): qty 50

## 2015-09-23 MED ORDER — ACETAMINOPHEN 325 MG PO TABS: 650.0000 mg | ORAL_TABLET | Freq: Four times a day (QID) | ORAL | Status: DC | PRN

## 2015-09-23 MED ORDER — DEXAMETHASONE SODIUM PHOSPHATE 4 MG/ML IJ SOLN
INTRAMUSCULAR | Status: AC
  Filled 2015-09-23: qty 1

## 2015-09-23 MED ORDER — SODIUM CHLORIDE 0.9 % IR SOLN
Status: DC | PRN
  Administered 2015-09-23: 1000 mL

## 2015-09-23 MED ORDER — TRIAMTERENE-HCTZ 75-50 MG PO TABS
1.0000 | ORAL_TABLET | Freq: Every day | ORAL | Status: DC
  Administered 2015-09-24 – 2015-09-26 (×3): 1 via ORAL
  Filled 2015-09-23 (×4): qty 1

## 2015-09-23 MED ORDER — EPHEDRINE SULFATE 50 MG/ML IJ SOLN
INTRAMUSCULAR | Status: DC | PRN
  Administered 2015-09-23 (×2): 5 mg via INTRAVENOUS
  Administered 2015-09-23: 10 mg via INTRAVENOUS
  Administered 2015-09-23: 5 mg via INTRAVENOUS

## 2015-09-23 MED ORDER — METHOCARBAMOL 1000 MG/10ML IJ SOLN
500.0000 mg | Freq: Four times a day (QID) | INTRAVENOUS | Status: DC | PRN
  Filled 2015-09-23: qty 5

## 2015-09-23 MED ORDER — FENTANYL CITRATE (PF) 250 MCG/5ML IJ SOLN
INTRAMUSCULAR | Status: DC | PRN
  Administered 2015-09-23: 25 ug via INTRAVENOUS
  Administered 2015-09-23: 50 ug via INTRAVENOUS

## 2015-09-23 MED ORDER — PROMETHAZINE HCL 25 MG/ML IJ SOLN: 6.2500 mg | INTRAMUSCULAR | Status: DC | PRN

## 2015-09-23 MED ORDER — MENTHOL 3 MG MT LOZG: 1.0000 | LOZENGE | OROMUCOSAL | Status: DC | PRN

## 2015-09-23 MED ORDER — TRANEXAMIC ACID 1000 MG/10ML IV SOLN: 1000.0000 mg | Freq: Once | INTRAVENOUS | Status: DC

## 2015-09-23 MED ORDER — OXYCODONE HCL 5 MG PO TABS
ORAL_TABLET | ORAL | Status: AC
  Filled 2015-09-23: qty 1

## 2015-09-23 MED ORDER — LIDOCAINE HCL (CARDIAC) 20 MG/ML IV SOLN
INTRAVENOUS | Status: DC | PRN
  Administered 2015-09-23: 100 mg via INTRAVENOUS

## 2015-09-23 MED ORDER — SENNOSIDES-DOCUSATE SODIUM 8.6-50 MG PO TABS: 1.0000 | ORAL_TABLET | Freq: Every evening | ORAL | Status: DC | PRN

## 2015-09-23 MED ORDER — ONDANSETRON HCL 4 MG/2ML IJ SOLN
INTRAMUSCULAR | Status: AC
  Filled 2015-09-23: qty 2

## 2015-09-23 MED ORDER — OXYCODONE HCL ER 20 MG PO T12A: 20.0000 mg | EXTENDED_RELEASE_TABLET | Freq: Two times a day (BID) | ORAL | Status: DC

## 2015-09-23 MED ORDER — LIDOCAINE HCL (CARDIAC) 20 MG/ML IV SOLN
INTRAVENOUS | Status: AC
  Filled 2015-09-23: qty 5

## 2015-09-23 MED ORDER — POTASSIUM CHLORIDE CRYS ER 20 MEQ PO TBCR
20.0000 meq | EXTENDED_RELEASE_TABLET | Freq: Every morning | ORAL | Status: DC
  Administered 2015-09-24 – 2015-09-26 (×3): 20 meq via ORAL
  Filled 2015-09-23 (×4): qty 1

## 2015-09-23 MED ORDER — HYDROMORPHONE HCL 1 MG/ML IJ SOLN: 0.2500 mg | INTRAMUSCULAR | Status: DC | PRN

## 2015-09-23 MED ORDER — FENTANYL CITRATE (PF) 250 MCG/5ML IJ SOLN
INTRAMUSCULAR | Status: AC
  Filled 2015-09-23: qty 5

## 2015-09-23 MED ORDER — ENOXAPARIN SODIUM 30 MG/0.3ML ~~LOC~~ SOLN
30.0000 mg | Freq: Two times a day (BID) | SUBCUTANEOUS | Status: DC
  Administered 2015-09-24 – 2015-09-26 (×5): 30 mg via SUBCUTANEOUS
  Filled 2015-09-23 (×5): qty 0.3

## 2015-09-23 MED ORDER — PHENYLEPHRINE HCL 10 MG/ML IJ SOLN
INTRAMUSCULAR | Status: DC | PRN
  Administered 2015-09-23 (×2): 40 ug via INTRAVENOUS

## 2015-09-23 MED ORDER — METOCLOPRAMIDE HCL 5 MG/ML IJ SOLN: 5.0000 mg | Freq: Three times a day (TID) | INTRAMUSCULAR | Status: DC | PRN

## 2015-09-23 MED ORDER — OXYCODONE HCL 5 MG PO TABS
5.0000 mg | ORAL_TABLET | Freq: Once | ORAL | Status: AC | PRN
Start: 2015-09-23 — End: 2015-09-23
  Administered 2015-09-23: 5 mg via ORAL

## 2015-09-23 MED ORDER — LATANOPROST 0.005 % OP SOLN
1.0000 [drp] | Freq: Every day | OPHTHALMIC | Status: DC
  Administered 2015-09-23 – 2015-09-26 (×3): 1 [drp] via OPHTHALMIC
  Filled 2015-09-23 (×3): qty 2.5

## 2015-09-23 MED ORDER — EPHEDRINE SULFATE 50 MG/ML IJ SOLN
INTRAMUSCULAR | Status: AC
  Filled 2015-09-23: qty 1

## 2015-09-23 MED ORDER — SODIUM CHLORIDE 0.9 % IV SOLN
INTRAVENOUS | Status: DC
  Administered 2015-09-23 – 2015-09-25 (×2): 75 mL/h via INTRAVENOUS

## 2015-09-23 MED ORDER — ONDANSETRON HCL 4 MG/2ML IJ SOLN
4.0000 mg | Freq: Four times a day (QID) | INTRAMUSCULAR | Status: DC | PRN
  Administered 2015-09-23 – 2015-09-24 (×3): 4 mg via INTRAVENOUS
  Filled 2015-09-23 (×3): qty 2

## 2015-09-23 MED ORDER — HYDROMORPHONE HCL 1 MG/ML IJ SOLN
1.0000 mg | INTRAMUSCULAR | Status: DC | PRN
  Administered 2015-09-23 – 2015-09-24 (×7): 1 mg via INTRAVENOUS
  Filled 2015-09-23 (×7): qty 1

## 2015-09-23 MED ORDER — MIDAZOLAM HCL 2 MG/2ML IJ SOLN
INTRAMUSCULAR | Status: AC
  Filled 2015-09-23: qty 2

## 2015-09-23 MED ORDER — DIPHENHYDRAMINE HCL 12.5 MG/5ML PO ELIX: 12.5000 mg | ORAL_SOLUTION | ORAL | Status: DC | PRN

## 2015-09-23 MED ORDER — PROPOFOL 500 MG/50ML IV EMUL
INTRAVENOUS | Status: DC | PRN
  Administered 2015-09-23: 50 ug/kg/min via INTRAVENOUS

## 2015-09-23 MED ORDER — MIDAZOLAM HCL 2 MG/2ML IJ SOLN
INTRAMUSCULAR | Status: DC | PRN
  Administered 2015-09-23 (×2): 1 mg via INTRAVENOUS

## 2015-09-23 MED ORDER — BUPIVACAINE-EPINEPHRINE 0.5% -1:200000 IJ SOLN
INTRAMUSCULAR | Status: DC | PRN
  Administered 2015-09-23: 30 mL

## 2015-09-23 MED ORDER — ONDANSETRON HCL 4 MG PO TABS: 4.0000 mg | ORAL_TABLET | Freq: Four times a day (QID) | ORAL | Status: DC | PRN

## 2015-09-23 MED ORDER — LACTATED RINGERS IV SOLN
INTRAVENOUS | Status: DC | PRN
Start: 2015-09-23 — End: 2015-09-23
  Administered 2015-09-23 (×2): via INTRAVENOUS

## 2015-09-23 MED ORDER — PHENOL 1.4 % MT LIQD: 1.0000 | OROMUCOSAL | Status: DC | PRN

## 2015-09-23 MED ORDER — OXYCODONE HCL 5 MG/5ML PO SOLN: 5.0000 mg | Freq: Once | ORAL | Status: AC | PRN

## 2015-09-23 MED ORDER — METOPROLOL TARTRATE 50 MG PO TABS
50.0000 mg | ORAL_TABLET | Freq: Two times a day (BID) | ORAL | Status: DC
  Administered 2015-09-24 – 2015-09-26 (×5): 50 mg via ORAL
  Filled 2015-09-23 (×7): qty 1

## 2015-09-23 MED ORDER — BISACODYL 5 MG PO TBEC: 5.0000 mg | DELAYED_RELEASE_TABLET | Freq: Every day | ORAL | Status: DC | PRN

## 2015-09-23 MED ORDER — ZOLPIDEM TARTRATE 5 MG PO TABS
5.0000 mg | ORAL_TABLET | Freq: Every evening | ORAL | Status: DC | PRN
  Filled 2015-09-23: qty 1

## 2015-09-23 MED ORDER — STERILE WATER FOR INJECTION IJ SOLN
INTRAMUSCULAR | Status: AC
  Filled 2015-09-23: qty 10

## 2015-09-23 MED ORDER — ALUM & MAG HYDROXIDE-SIMETH 200-200-20 MG/5ML PO SUSP: 30.0000 mL | ORAL | Status: DC | PRN

## 2015-09-23 MED ORDER — SODIUM CHLORIDE 0.9 % IJ SOLN
INTRAMUSCULAR | Status: DC | PRN
  Administered 2015-09-23: 20 mL

## 2015-09-23 MED ORDER — ONDANSETRON HCL 4 MG/2ML IJ SOLN
INTRAMUSCULAR | Status: DC | PRN
  Administered 2015-09-23: 4 mg via INTRAVENOUS

## 2015-09-23 MED ORDER — OXYCODONE HCL 5 MG PO TABS
5.0000 mg | ORAL_TABLET | ORAL | Status: DC | PRN
  Administered 2015-09-24 (×2): 15 mg via ORAL
  Filled 2015-09-23 (×2): qty 3

## 2015-09-23 MED ORDER — DOCUSATE SODIUM 100 MG PO CAPS
100.0000 mg | ORAL_CAPSULE | Freq: Two times a day (BID) | ORAL | Status: DC
  Administered 2015-09-23 – 2015-09-26 (×6): 100 mg via ORAL
  Filled 2015-09-23 (×6): qty 1

## 2015-09-23 MED ORDER — METOCLOPRAMIDE HCL 5 MG PO TABS: 5.0000 mg | ORAL_TABLET | Freq: Three times a day (TID) | ORAL | Status: DC | PRN

## 2015-09-23 MED ORDER — LACTATED RINGERS IV SOLN
INTRAVENOUS | Status: DC
  Administered 2015-09-23: 09:00:00 via INTRAVENOUS

## 2015-09-23 SURGICAL SUPPLY — 58 items
BANDAGE ACE 6X5 VEL STRL LF (GAUZE/BANDAGES/DRESSINGS) ×2 IMPLANT
BANDAGE ESMARK 6X9 LF (GAUZE/BANDAGES/DRESSINGS) ×1 IMPLANT
BLADE SAGITTAL 13X1.27X60 (BLADE) ×2 IMPLANT
BLADE SAW SGTL 83.5X18.5 (BLADE) ×2 IMPLANT
BLADE SURG 10 STRL SS (BLADE) ×2 IMPLANT
BNDG ESMARK 6X9 LF (GAUZE/BANDAGES/DRESSINGS) ×2
BOWL SMART MIX CTS (DISPOSABLE) ×2 IMPLANT
CAPT KNEE TOTAL 3 ×2 IMPLANT
CEMENT BONE SIMPLEX SPEEDSET (Cement) ×4 IMPLANT
COVER SURGICAL LIGHT HANDLE (MISCELLANEOUS) ×2 IMPLANT
CUFF TOURNIQUET SINGLE 34IN LL (TOURNIQUET CUFF) ×2 IMPLANT
DRAPE EXTREMITY T 121X128X90 (DRAPE) ×2 IMPLANT
DRAPE INCISE IOBAN 66X45 STRL (DRAPES) ×4 IMPLANT
DRAPE PROXIMA HALF (DRAPES) ×2 IMPLANT
DRAPE U-SHAPE 47X51 STRL (DRAPES) ×2 IMPLANT
DRSG ADAPTIC 3X8 NADH LF (GAUZE/BANDAGES/DRESSINGS) ×2 IMPLANT
DRSG PAD ABDOMINAL 8X10 ST (GAUZE/BANDAGES/DRESSINGS) ×2 IMPLANT
DURAPREP 26ML APPLICATOR (WOUND CARE) ×2 IMPLANT
ELECT REM PT RETURN 9FT ADLT (ELECTROSURGICAL) ×2
ELECTRODE REM PT RTRN 9FT ADLT (ELECTROSURGICAL) ×1 IMPLANT
GAUZE SPONGE 4X4 12PLY STRL (GAUZE/BANDAGES/DRESSINGS) ×2 IMPLANT
GLOVE BIOGEL M 7.0 STRL (GLOVE) IMPLANT
GLOVE BIOGEL PI IND STRL 7.5 (GLOVE) IMPLANT
GLOVE BIOGEL PI IND STRL 8.5 (GLOVE) ×3 IMPLANT
GLOVE BIOGEL PI INDICATOR 7.5 (GLOVE)
GLOVE BIOGEL PI INDICATOR 8.5 (GLOVE) ×3
GLOVE SURG ORTHO 8.0 STRL STRW (GLOVE) ×6 IMPLANT
GOWN STRL REUS W/ TWL LRG LVL3 (GOWN DISPOSABLE) ×1 IMPLANT
GOWN STRL REUS W/ TWL XL LVL3 (GOWN DISPOSABLE) ×3 IMPLANT
GOWN STRL REUS W/TWL LRG LVL3 (GOWN DISPOSABLE) ×1
GOWN STRL REUS W/TWL XL LVL3 (GOWN DISPOSABLE) ×3
HANDPIECE INTERPULSE COAX TIP (DISPOSABLE) ×1
HOOD PEEL AWAY FACE SHEILD DIS (HOOD) ×8 IMPLANT
KIT BASIN OR (CUSTOM PROCEDURE TRAY) ×2 IMPLANT
KIT ROOM TURNOVER OR (KITS) ×2 IMPLANT
KNEE CAPITATED TOTAL 3 ×1 IMPLANT
MANIFOLD NEPTUNE II (INSTRUMENTS) ×2 IMPLANT
NEEDLE 22X1 1/2 (OR ONLY) (NEEDLE) ×4 IMPLANT
NS IRRIG 1000ML POUR BTL (IV SOLUTION) ×2 IMPLANT
PACK TOTAL JOINT (CUSTOM PROCEDURE TRAY) ×2 IMPLANT
PACK UNIVERSAL I (CUSTOM PROCEDURE TRAY) ×2 IMPLANT
PAD ARMBOARD 7.5X6 YLW CONV (MISCELLANEOUS) ×4 IMPLANT
PADDING CAST COTTON 6X4 STRL (CAST SUPPLIES) ×2 IMPLANT
SET HNDPC FAN SPRY TIP SCT (DISPOSABLE) ×1 IMPLANT
STAPLER VISISTAT 35W (STAPLE) ×2 IMPLANT
SUCTION FRAZIER TIP 10 FR DISP (SUCTIONS) ×2 IMPLANT
SUT BONE WAX W31G (SUTURE) ×2 IMPLANT
SUT FIBERWIRE #2 38 T-5 BLUE (SUTURE) ×2
SUT VIC AB 0 CTB1 27 (SUTURE) ×6 IMPLANT
SUT VIC AB 1 CT1 27 (SUTURE) ×3
SUT VIC AB 1 CT1 27XBRD ANBCTR (SUTURE) ×3 IMPLANT
SUT VIC AB 2-0 CT1 27 (SUTURE) ×2
SUT VIC AB 2-0 CT1 TAPERPNT 27 (SUTURE) ×2 IMPLANT
SUTURE FIBERWR #2 38 T-5 BLUE (SUTURE) ×1 IMPLANT
SYR 20CC LL (SYRINGE) ×4 IMPLANT
TOWEL OR 17X24 6PK STRL BLUE (TOWEL DISPOSABLE) ×2 IMPLANT
TOWEL OR 17X26 10 PK STRL BLUE (TOWEL DISPOSABLE) ×2 IMPLANT
TRAY CATH 16FR W/PLASTIC CATH (SET/KITS/TRAYS/PACK) ×2 IMPLANT

## 2015-09-23 NOTE — H&P (Signed)
Elizabeth Singh MRN:  [id number] DOB/SEX:  Nov 08, 1950/female  CHIEF COMPLAINT:  Painful left Knee  HISTORY: Patient is a 65 y.o. female presented with a history of pain in the left knee. Onset of symptoms was gradual starting several years ago with gradually worsening course since that time. Prior procedures on the knee include arthroscopy. Patient has been treated conservatively with over-the-counter NSAIDs and activity modification. Patient currently rates pain in the knee at 10 out of 10 with activity. There is pain at night.  PAST MEDICAL HISTORY: There are no active problems to display for this patient.  Past Medical History  Diagnosis Date  . PONV (postoperative nausea and vomiting)     once post surgery  . Hypertension   . High cholesterol   . Arthritis   . Joint pain   . GERD (gastroesophageal reflux disease)   . H/O hiatal hernia   . Thyroid disorder     benign goiter  . Glaucoma   . PVC (premature ventricular contraction)     hx of - 2002 potassium was low  . Hx: UTI (urinary tract infection)     2011 - frequent uti's (after stopping water aerobics the uti's stopped)  . Headache     hx of migraines (none in years)  . Fatty liver   . Diverticulosis    Past Surgical History  Procedure Laterality Date  . Cholecystectomy  1974  . Tubal ligation  1978  . Breast surgery  2002    left benign lumpectomy  . Knee arthroscopy  2001    right  . Cardiac catheterization  2002  . Total knee arthroplasty Right 01/23/2013    Procedure: TOTAL KNEE ARTHROPLASTY;  Surgeon: Vickey Huger, MD;  Location: [place];  Service: Orthopedics;  Laterality: Right;  . Colonoscopy    . Appendectomy       MEDICATIONS:   No prescriptions prior to admission    ALLERGIES:   Allergies  Allergen Reactions  . Sulfa Antibiotics Itching    REVIEW OF SYSTEMS:  Pertinent items noted in HPI and remainder of comprehensive ROS otherwise negative.   FAMILY HISTORY:   Family History  Problem  Relation Age of Onset  . Kidney failure Mother   . Hypertension Mother     SOCIAL HISTORY:   Social History  Substance Use Topics  . Smoking status: Former Smoker    Quit date: 09/12/1990  . Smokeless tobacco: Never Used  . Alcohol Use: Yes     Comment: occassional     EXAMINATION:  Vital signs in last 24 hours:    General appearance: alert, cooperative and no distress Lungs: clear to auscultation bilaterally Heart: regular rate and rhythm, S1, S2 normal, no murmur, click, rub or gallop Abdomen: soft, non-tender; bowel sounds normal; no masses,  no organomegaly Extremities: extremities normal, atraumatic, no cyanosis or edema and Homans sign is negative, no sign of DVT Pulses: 2+ and symmetric Skin: Skin color, texture, turgor normal. No rashes or lesions Neurologic: Alert and oriented X 3, normal strength and tone. Normal symmetric reflexes. Normal coordination and gait  Musculoskeletal:  ROM 0-100, Ligaments intact,  Imaging Review Plain radiographs demonstrate severe degenerative joint disease of the left knee. The overall alignment is significant varus. The bone quality appears to be good for age and reported activity level.  Assessment/Plan: Primary osteoarthritis, left knee   The patient history, physical examination and imaging studies are consistent with advanced degenerative joint disease of the left knee. The patient has failed conservative  treatment.  The clearance notes were reviewed.  After discussion with the patient it was felt that Total Knee Replacement was indicated. The procedure,  risks, and benefits of total knee arthroplasty were presented and reviewed. The risks including but not limited to aseptic loosening, infection, blood clots, vascular injury, stiffness, patella tracking problems complications among others were discussed. The patient acknowledged the explanation, agreed to proceed with the plan.  Zakiah Gauthreaux 09/23/2015, 6:46 AM

## 2015-09-23 NOTE — Anesthesia Procedure Notes (Addendum)
Spinal Patient location during procedure: OR Start time: 09/23/2015 9:41 AM End time: 09/23/2015 9:43 AM Staffing Anesthesiologist: JUDD, BENJAMIN Performed by: anesthesiologist  Preanesthetic Checklist Completed: patient identified, site marked, surgical consent, pre-op evaluation, timeout performed, IV checked, risks and benefits discussed and monitors and equipment checked Spinal Block Patient position: sitting Prep: DuraPrep Patient monitoring: heart rate, continuous pulse ox and blood pressure Location: L4-5 Injection technique: single-shot Needle Needle type: Quincke  Needle gauge: 22 G Needle length: 9 cm Additional Notes Functioning IV was confirmed and monitors were applied. Sterile prep and drape, including hand hygiene, mask and sterile gloves were used. The patient was positioned and the spine was prepped. The skin was anesthetized with lidocaine.  Free flow of clear CSF was obtained prior to injecting local anesthetic into the CSF.  The spinal needle aspirated freely following injection.  The needle was carefully withdrawn.  The patient tolerated the procedure well. Consent was obtained prior to procedure with all questions answered and concerns addressed.  Elizabeth Pink, MD    Procedure Name: LMA Insertion Date/Time: 09/23/2015 10:00 AM Performed by: Ollen Bowl Pre-anesthesia Checklist: Patient identified, Emergency Drugs available, Suction available, Patient being monitored and Timeout performed Patient Re-evaluated:Patient Re-evaluated prior to inductionOxygen Delivery Method: Circle system utilized and Simple face mask Preoxygenation: Pre-oxygenation with 100% oxygen Intubation Type: IV induction Ventilation: Mask ventilation without difficulty LMA: LMA inserted LMA Size: 5.0 Number of attempts: 1 Airway Equipment and Method: Patient positioned with wedge pillow Placement Confirmation: positive ETCO2 and breath sounds checked- equal and bilateral Tube secured  with: Tape Dental Injury: Teeth and Oropharynx as per pre-operative assessment

## 2015-09-23 NOTE — Progress Notes (Signed)
Patient has an order for Ambien 5 mg. RN pulled Ambien 5 mg from patients medication list at [place] administered Ambien 5 mg to another patient in her assignment with the same medication and dosage order with a similar last name. Patient never requested nor received Ambien 5 mg. RN made Charge RN aware and called Pharmacy and spoke with Ignatius Specking who instructed RN to document a progress note on both patients of the occurrence and assured the RN that the patient would not be charged for the Ambien because the medication was never scanned because she did not receive it. Nursing will continue to monitor.

## 2015-09-23 NOTE — Progress Notes (Signed)
Orthopedic Tech Progress Note Patient Details:  Elizabeth Singh Oct 09, 1950 [id number] On cpm at 1900 Patient ID: Elizabeth Singh, female   DOB: Jan 02, 1951, 65 y.o.   MRN: [id number]   Elizabeth Singh 09/23/2015, 6:50 PM

## 2015-09-23 NOTE — Anesthesia Postprocedure Evaluation (Signed)
Anesthesia Post Note  Patient: Elizabeth Singh  Procedure(s) Performed: Procedure(s) (LRB): LEFT TOTAL KNEE ARTHROPLASTY (Left)  Patient location during evaluation: PACU Anesthesia Type: General and Spinal Level of consciousness: awake and alert Pain management: pain level controlled Vital Signs Assessment: post-procedure vital signs reviewed and stable Respiratory status: spontaneous breathing, nonlabored ventilation, respiratory function stable and patient connected to nasal cannula oxygen Cardiovascular status: blood pressure returned to baseline and stable Postop Assessment: no signs of nausea or vomiting Anesthetic complications: no    Last Vitals:  Filed Vitals:   09/23/15 1132 09/23/15 1147  BP: 126/62 124/67  Pulse: 80 74  Temp:    Resp: 13 10    Last Pain: There were no vitals filed for this visit.               Zenaida Deed

## 2015-09-23 NOTE — Progress Notes (Signed)
Orthopedic Tech Progress Note Patient Details:  Elizabeth Singh 23-Jun-1951 [id number]  Patient ID: Elizabeth Singh, female   DOB: 1951/03/31, 65 y.o.   MRN: [id number] Charge for footsie roll to  Be deleted  [name] 09/23/2015, 11:50 AM

## 2015-09-23 NOTE — Transfer of Care (Signed)
Immediate Anesthesia Transfer of Care Note  Patient: Elizabeth Singh  Procedure(s) Performed: Procedure(s): LEFT TOTAL KNEE ARTHROPLASTY (Left)  Patient Location: PACU  Anesthesia Type:General  Level of Consciousness: awake and alert   Airway & Oxygen Therapy: Patient Spontanous Breathing and Patient connected to nasal cannula oxygen  Post-op Assessment: Report given to RN and Post -op Vital signs reviewed and stable  Post vital signs: Reviewed and stable  Last Vitals:  Filed Vitals:   09/23/15 0823  BP: 150/64  Pulse: 60  Temp: 36.6 C  Resp: 18    Complications: No apparent anesthesia complications

## 2015-09-23 NOTE — Progress Notes (Signed)
Orthopedic Tech Progress Note Patient Details:  Elizabeth Singh 12-06-1950 [id number]  CPM Left Knee CPM Left Knee: On Left Knee Flexion (Degrees): 90 Left Knee Extension (Degrees): 0 Additional Comments: trapeze bar patient helper Viewed order from doctor's order list  [name] 09/23/2015, 11:40 AM

## 2015-09-24 ENCOUNTER — Encounter (HOSPITAL_COMMUNITY): Payer: Self-pay | Admitting: Orthopedic Surgery

## 2015-09-24 LAB — BASIC METABOLIC PANEL
Anion gap: 10 (ref 5–15)
BUN: 13 mg/dL (ref 6–20)
CALCIUM: 8.5 mg/dL — AB (ref 8.9–10.3)
CHLORIDE: 105 mmol/L (ref 101–111)
CO2: 22 mmol/L (ref 22–32)
CREATININE: 1.07 mg/dL — AB (ref 0.44–1.00)
GFR calc non Af Amer: 53 mL/min — ABNORMAL LOW (ref >60)
Glucose, Bld: 133 mg/dL — ABNORMAL HIGH (ref 65–99)
Potassium: 3.9 mmol/L (ref 3.5–5.1)
Sodium: 137 mmol/L (ref 135–145)

## 2015-09-24 LAB — CBC
HEMATOCRIT: 36.7 % (ref 36.0–46.0)
HEMOGLOBIN: 11.5 g/dL — AB (ref 12.0–15.0)
MCH: 28.4 pg (ref 26.0–34.0)
MCHC: 31.3 g/dL (ref 30.0–36.0)
MCV: 90.6 fL (ref 78.0–100.0)
Platelets: 196 10*3/uL (ref 150–400)
RBC: 4.05 MIL/uL (ref 3.87–5.11)
RDW: 14.2 % (ref 11.5–15.5)
WBC: 10.6 10*3/uL — ABNORMAL HIGH (ref 4.0–10.5)

## 2015-09-24 MED ORDER — EPHEDRINE SULFATE 50 MG/ML IJ SOLN
INTRAMUSCULAR | Status: AC
Start: 2015-09-24 — End: 2015-09-24
  Filled 2015-09-24: qty 1

## 2015-09-24 MED ORDER — PHENYLEPHRINE 40 MCG/ML (10ML) SYRINGE FOR IV PUSH (FOR BLOOD PRESSURE SUPPORT)
PREFILLED_SYRINGE | INTRAVENOUS | Status: AC
  Filled 2015-09-24: qty 10

## 2015-09-24 MED ORDER — HYDROMORPHONE HCL 1 MG/ML IJ SOLN
2.0000 mg | INTRAMUSCULAR | Status: DC | PRN
  Administered 2015-09-24 – 2015-09-25 (×4): 2 mg via INTRAVENOUS
  Filled 2015-09-24 (×4): qty 2

## 2015-09-24 MED ORDER — ENOXAPARIN SODIUM 40 MG/0.4ML ~~LOC~~ SOLN: 40.0000 mg | SUBCUTANEOUS | Status: DC

## 2015-09-24 MED ORDER — SUCCINYLCHOLINE CHLORIDE 20 MG/ML IJ SOLN
INTRAMUSCULAR | Status: AC
  Filled 2015-09-24: qty 1

## 2015-09-24 MED ORDER — OXYCODONE HCL 5 MG PO TABS: 5.0000 mg | ORAL_TABLET | ORAL | Status: DC | PRN

## 2015-09-24 MED ORDER — OXYCODONE HCL 5 MG PO TABS
10.0000 mg | ORAL_TABLET | ORAL | Status: DC | PRN
  Administered 2015-09-24 (×2): 10 mg via ORAL
  Filled 2015-09-24 (×2): qty 4
  Filled 2015-09-24: qty 2

## 2015-09-24 MED ORDER — OXYCODONE HCL ER 20 MG PO T12A: 20.0000 mg | EXTENDED_RELEASE_TABLET | Freq: Two times a day (BID) | ORAL | Status: DC

## 2015-09-24 MED ORDER — STERILE WATER FOR INJECTION IJ SOLN
INTRAMUSCULAR | Status: AC
  Filled 2015-09-24: qty 10

## 2015-09-24 MED ORDER — METHOCARBAMOL 500 MG PO TABS: 500.0000 mg | ORAL_TABLET | Freq: Four times a day (QID) | ORAL | Status: DC | PRN

## 2015-09-24 NOTE — Evaluation (Signed)
Physical Therapy Evaluation Patient Details Name: Elizabeth Singh MRN: [id number] DOB: 15-Sep-1951 Today's Date: 09/24/2015   History of Present Illness  pt admitted for L TKA. PMHx: R TKA, HTN, arthritis, glaucoma  Clinical Impression  Pt pleasant but slightly woozy from meds this am. Pt reports increased pain this TKA compared to R TKA. Pt educated for CPM use, bone foam, plan, HEP and progression. Pt with decreased strength, ROM, transfers, gait and functional mobility who will benefit from acute therapy to maximize mobility, independence and improve all deficits.     Follow Up Recommendations Home health PT    Equipment Recommendations  None recommended by PT    Recommendations for Other Services       Precautions / Restrictions Precautions Precautions: Knee;Fall Restrictions LLE Weight Bearing: Weight bearing as tolerated      Mobility  Bed Mobility Overal bed mobility: Needs Assistance Bed Mobility: Supine to Sit     Supine to sit: Min assist;HOB elevated     General bed mobility comments: HOB 40 degrees, increased time with assist to fully move LLE to EOB  Transfers Overall transfer level: Needs assistance   Transfers: Sit to/from Stand Sit to Stand: Min assist         General transfer comment: cues for hand placement and sequence  Ambulation/Gait Ambulation/Gait assistance: Min guard Ambulation Distance (Feet): 15 Feet Assistive device: Rolling walker (2 wheeled) Gait Pattern/deviations: Step-to pattern;Trunk flexed   Gait velocity interpretation: Below normal speed for age/gender General Gait Details: cues for sequence, posture, looking up  Stairs            Wheelchair Mobility    Modified Rankin (Stroke Patients Only)       Balance                                             Pertinent Vitals/Pain Pain Assessment: 0-10 Pain Score: 5  Pain Location: left knee Pain Descriptors / Indicators: Aching Pain  Intervention(s): Monitored during session;Limited activity within patient's tolerance;Premedicated before session;Repositioned    Home Living Family/patient expects to be discharged to:: Private residence Living Arrangements: Children Available Help at Discharge: Family;Available 24 hours/day Type of Home: House Home Access: Level entry     Home Layout: One level Home Equipment: Walker - standard;Shower seat;Grab bars - toilet      Prior Function Level of Independence: Independent               Hand Dominance        Extremity/Trunk Assessment   Upper Extremity Assessment: Overall WFL for tasks assessed           Lower Extremity Assessment: LLE deficits/detail   LLE Deficits / Details: limited due to pain and edema post op  Cervical / Trunk Assessment: Normal  Communication   Communication: No difficulties  Cognition Arousal/Alertness: Awake/alert Behavior During Therapy: WFL for tasks assessed/performed Overall Cognitive Status: Within Functional Limits for tasks assessed                      General Comments      Exercises Total Joint Exercises Quad Sets: AROM;Left;5 reps;Supine Heel Slides: AAROM;Left;5 reps;Supine Straight Leg Raises: AAROM;Left;5 reps;Supine      Assessment/Plan    PT Assessment Patient needs continued PT services  PT Diagnosis Difficulty walking;Acute pain   PT Problem List Decreased strength;Decreased range of  motion;Decreased activity tolerance;Pain;Decreased knowledge of use of DME;Decreased mobility  PT Treatment Interventions DME instruction;Gait training;Functional mobility training;Therapeutic activities;Therapeutic exercise;Patient/family education   PT Goals (Current goals can be found in the Care Plan section) Acute Rehab PT Goals Patient Stated Goal: return to housework and dancing PT Goal Formulation: With patient/family Time For Goal Achievement: 10/01/15 Potential to Achieve Goals: Good    Frequency  7X/week   Barriers to discharge        Co-evaluation               End of Session Equipment Utilized During Treatment: Gait belt Activity Tolerance: Patient tolerated treatment well Patient left: in chair;with call bell/phone within reach;with family/visitor present Nurse Communication: Mobility status;Weight bearing status         Time: 0712-0740 PT Time Calculation (min) (ACUTE ONLY): 28 min   Charges:   PT Evaluation $Initial PT Evaluation Tier I: 1 Procedure moderate PT Treatments $Gait Training: 8-22 mins   PT G CodesMelford Aase 09/24/2015, 7:46 AM Elwyn Reach, [place]

## 2015-09-24 NOTE — Discharge Instructions (Signed)

## 2015-09-24 NOTE — Progress Notes (Signed)
Orthopedic Tech Progress Note Patient Details:  Elizabeth Singh 1951/03/11 [id number] On cpm at 1900 Patient ID: Elizabeth Singh, female   DOB: [birth year], 65 y.o.   MRN: [id number]   [name] 09/24/2015, 7:04 PM

## 2015-09-24 NOTE — Progress Notes (Signed)
Utilization review completed.  

## 2015-09-24 NOTE — Progress Notes (Signed)
Orthopedic Tech Progress Note Patient Details:  Elizabeth Singh [birth year] [id number]  Patient ID: Elizabeth Singh, female   DOB: 1951-08-23, 65 y.o.   MRN: [id number] Placed pt's lle on cpm @0 -45 degrees @1345   [name] 09/24/2015, 1:46 PM

## 2015-09-24 NOTE — Evaluation (Signed)
Occupational Therapy Evaluation Patient Details Name: Elizabeth Singh MRN: [id number] DOB: 1951-08-07 Today's Date: 09/24/2015    History of Present Illness pt admitted for L TKA. PMHx: R TKA, HTN, arthritis, glaucoma   Clinical Impression   Pt reports she was independent with ADLs PTA. Currently pt is overall min guard for ADLs and functional mobility with the exception of min assist for LB ADLs. Began safety and ADL education with pt and daughter. Pt planning to d/c home with 24/7 supervision from her daughter initially. Pt would benefit from continued skilled OT services in order to maximize independence with LB ADLs, toilet transfers, tub transfers and ADLs in standing.     Follow Up Recommendations  No OT follow up;Supervision/Assistance - 24 hour    Equipment Recommendations  None recommended by OT    Recommendations for Other Services       Precautions / Restrictions Precautions Precautions: Knee;Fall Restrictions Weight Bearing Restrictions: Yes LLE Weight Bearing: Weight bearing as tolerated      Mobility Bed Mobility Overal bed mobility: Needs Assistance Bed Mobility: Sit to Supine     Supine to sit: Min assist;HOB elevated Sit to supine: Min assist   General bed mobility comments: Min assist to manage LLE into bed. HOB flat and increased time required.  Transfers Overall transfer level: Needs assistance Equipment used: Rolling walker (2 wheeled) Transfers: Sit to/from Stand Sit to Stand: Min assist         General transfer comment: Min guard for stand to sit. Good hand placement and technique.    Balance Overall balance assessment: Needs assistance Sitting-balance support: Feet supported Sitting balance-Leahy Scale: Good     Standing balance support: No upper extremity supported;During functional activity Standing balance-Leahy Scale: Fair Standing balance comment: Pt able to doff hospital gown in standing without UE support                             ADL Overall ADL's : Needs assistance/impaired Eating/Feeding: Set up;Sitting   Grooming: Min guard;Standing       Lower Body Bathing: Minimal assistance;Sit to/from stand   Upper Body Dressing : Min guard;Standing Upper Body Dressing Details (indicate cue type and reason): Pt able to doff hospital gown in standing with min guard for safety. Lower Body Dressing: Minimal assistance;Sit to/from stand   Toilet Transfer: Min guard;Ambulation;BSC;RW (BSC over toilet)           Functional mobility during ADLs: Min guard;Rolling walker General ADL Comments: Pts daughter present for OT eval. Educated on ice for edema and pain, home safety; pt verbalized understanding. Eval limited seondary to pt wanting to get back to bed and pain.      Vision     Perception     Praxis      Pertinent Vitals/Pain Pain Assessment: 0-10 Pain Score: 5  Pain Location: L knee Pain Descriptors / Indicators: Aching Pain Intervention(s): Limited activity within patient's tolerance;Monitored during session;Repositioned;Ice applied     Hand Dominance     Extremity/Trunk Assessment Upper Extremity Assessment Upper Extremity Assessment: Overall WFL for tasks assessed   Lower Extremity Assessment Lower Extremity Assessment: Defer to PT evaluation LLE Deficits / Details: limited due to pain and edema post op   Cervical / Trunk Assessment Cervical / Trunk Assessment: Normal   Communication Communication Communication: No difficulties   Cognition Arousal/Alertness: Awake/alert Behavior During Therapy: WFL for tasks assessed/performed Overall Cognitive Status: Within Functional Limits for tasks assessed  General Comments       Exercises       Shoulder Instructions      Home Living Family/patient expects to be discharged to:: Private residence Living Arrangements: Alone Available Help at Discharge: Family;Available 24 hours/day Type of Home:  House Home Access: Level entry     Home Layout: One level     Bathroom Shower/Tub: Tub/shower unit Shower/tub characteristics: Curtain Biochemist, clinical: Handicapped height Bathroom Accessibility: Yes How Accessible: Accessible via walker Home Equipment: Walker - standard;Shower seat;Grab bars - toilet;Bedside commode;Grab bars - tub/shower          Prior Functioning/Environment Level of Independence: Independent             OT Diagnosis: Acute pain   OT Problem List: Decreased activity tolerance;Impaired balance (sitting and/or standing);Decreased knowledge of use of DME or AE;Decreased knowledge of precautions;Pain   OT Treatment/Interventions: Self-care/ADL training;DME and/or AE instruction;Patient/family education    OT Goals(Current goals can be found in the care plan section) Acute Rehab OT Goals Patient Stated Goal: return to housework and dancing OT Goal Formulation: With patient Time For Goal Achievement: 10/08/15 Potential to Achieve Goals: Good ADL Goals Pt Will Perform Grooming: with supervision;standing Pt Will Perform Lower Body Bathing: with supervision;sit to/from stand Pt Will Perform Lower Body Dressing: with supervision;sit to/from stand Pt Will Transfer to Toilet: with supervision;ambulating;bedside commode (BSC over toilet) Pt Will Perform Toileting - Clothing Manipulation and hygiene: with supervision;sit to/from stand Pt Will Perform Tub/Shower Transfer: Tub transfer;ambulating;with supervision;rolling walker;shower seat;grab bars  OT Frequency: Min 2X/week   Barriers to D/C:            Co-evaluation              End of Session Equipment Utilized During Treatment: Rolling walker CPM Left Knee CPM Left Knee: Off  Activity Tolerance: Patient limited by pain Patient left: in bed;with call bell/phone within reach;with family/visitor present;Other (comment) (zero degree bone foam applied to LLE)   Time: [id number] OT Time Calculation  (min): 14 min Charges:  OT General Charges $OT Visit: 1 Procedure OT Evaluation $Initial OT Evaluation Tier I: 1 Procedure G-Codes:     Binnie Kand M.S., OTR/L Pager: [phone]  09/24/2015, 9:52 AM

## 2015-09-24 NOTE — Progress Notes (Signed)
Physical Therapy Treatment Patient Details Name: Elizabeth Singh MRN: [id number] DOB: 1951-06-30 Today's Date: 09/24/2015    History of Present Illness pt admitted for L TKA. PMHx: R TKA, HTN, arthritis, glaucoma    PT Comments    Pt with improved gait this afternoon able to ambulate in hall with slow cautious gait. Pt with very limited ROM left knee secondary to pain and fatigue. Encouraged CPM after lunch and continued HEP today. Pt reports overall fatigue from having not slept. Pt in bathroom with family present end of session.   Follow Up Recommendations  Home health PT     Equipment Recommendations       Recommendations for Other Services       Precautions / Restrictions Precautions Precautions: Knee;Fall Restrictions Weight Bearing Restrictions: Yes LLE Weight Bearing: Weight bearing as tolerated    Mobility  Bed Mobility Overal bed mobility: Needs Assistance Bed Mobility: Sit to Supine     Supine to sit: Supervision Sit to supine: Min assist   General bed mobility comments: increased time with HOB elevated and use of bil UE to move LLE  Transfers Overall transfer level: Needs assistance Equipment used: Rolling walker (2 wheeled) Transfers: Sit to/from Stand Sit to Stand: Min guard         General transfer comment: cues for sequence   Ambulation/Gait Ambulation/Gait assistance: Min guard Ambulation Distance (Feet): 125 Feet Assistive device: Rolling walker (2 wheeled) Gait Pattern/deviations: Step-through pattern;Decreased stride length;Decreased weight shift to left;Decreased dorsiflexion - left   Gait velocity interpretation: Below normal speed for age/gender General Gait Details: cues for sequence, posture, looking up   Stairs            Wheelchair Mobility    Modified Rankin (Stroke Patients Only)       Balance Overall balance assessment: Needs assistance Sitting-balance support: Feet supported Sitting balance-Leahy Scale: Good     Standing balance support: No upper extremity supported;During functional activity Standing balance-Leahy Scale: Fair Standing balance comment: Pt able to doff hospital gown in standing without UE support                    Cognition Arousal/Alertness: Awake/alert Behavior During Therapy: WFL for tasks assessed/performed Overall Cognitive Status: Within Functional Limits for tasks assessed                      Exercises Total Joint Exercises Quad Sets: AROM;Left;5 reps;Supine Short Arc Quad: AAROM;Left;10 reps;Supine Heel Slides: AAROM;Left;10 reps;Supine Straight Leg Raises: AAROM;Left;10 reps;Supine Goniometric ROM: 12-35    General Comments        Pertinent Vitals/Pain Pain Assessment: 0-10 Pain Score: 5  Pain Location: left knee Pain Descriptors / Indicators: Aching Pain Intervention(s): Limited activity within patient's tolerance;Monitored during session;Repositioned;RN gave pain meds during session    [place] expects to be discharged to:: Private residence Living Arrangements: Alone Available Help at Discharge: Family;Available 24 hours/day Type of Home: House Home Access: Level entry   Home Layout: One level Home Equipment: Walker - standard;Shower seat;Grab bars - toilet;Bedside commode;Grab bars - tub/shower      Prior Function Level of Independence: Independent          PT Goals (current goals can now be found in the care plan section) Acute Rehab PT Goals Patient Stated Goal: return to housework and dancing Progress towards PT goals: Progressing toward goals    Frequency       PT Plan Current plan remains appropriate  Co-evaluation             End of Session Equipment Utilized During Treatment: Gait belt Activity Tolerance: Patient tolerated treatment well Patient left: with call bell/phone within reach;Other (comment);with family/visitor present (in bathroom per pt request)     Time: [id number] PT  Time Calculation (min) (ACUTE ONLY): 49 min  Charges:  $Gait Training: 23-37 mins $Therapeutic Exercise: 8-22 mins                    G Codes:      [name] 2015-10-23, 12:26 PM Elwyn Reach, [place]

## 2015-09-24 NOTE — Progress Notes (Signed)
SPORTS MEDICINE AND JOINT REPLACEMENT  [name], MD   Carlynn Spry, PA-C [place], [place], [place]  [address]                             [phone]   PROGRESS NOTE  Subjective:  negative for Chest Pain  negative for Shortness of Breath  negative for Nausea/Vomiting   negative for Calf Pain  negative for Bowel Movement   Tolerating Diet: yes         Patient reports pain as 6 on 0-10 scale.    Objective: Vital signs in last 24 hours:   Patient Vitals for the past 24 hrs:  BP Temp Temp src Pulse Resp SpO2 Height Weight  09/24/15 0621 (!) 119/40 mmHg 97.8 F (36.6 C) Oral 69 16 96 % - -  09/24/15 0053 (!) 117/59 mmHg 97.5 F (36.4 C) Oral 78 16 97 % - -  09/23/15 2005 (!) 129/53 mmHg 97.4 F (36.3 C) Oral (!) 57 16 100 % - -  09/23/15 1700 (!) 130/57 mmHg 97.7 F (36.5 C) Oral 62 16 98 % - -  09/23/15 1600 132/66 mmHg 98.2 F (36.8 C) - 68 14 99 % - -  09/23/15 1545 132/66 mmHg - - 64 14 99 % - -  09/23/15 1530 131/67 mmHg - - 62 14 99 % - -  09/23/15 1515 129/66 mmHg - - 62 14 99 % - -  09/23/15 1500 129/61 mmHg - - (!) 58 14 99 % - -  09/23/15 1445 134/64 mmHg - - (!) 58 13 100 % - -  09/23/15 1430 133/64 mmHg - - 60 14 99 % - -  09/23/15 1415 124/65 mmHg - - (!) 58 13 100 % - -  09/23/15 1402 127/63 mmHg - - 62 14 100 % - -  09/23/15 1400 - - - (!) 59 14 99 % - -  09/23/15 1347 126/65 mmHg - - (!) 57 13 100 % - -  09/23/15 1332 125/69 mmHg - - 64 13 100 % - -  09/23/15 1317 129/61 mmHg - - (!) 56 12 100 % - -  09/23/15 1302 124/67 mmHg - - (!) 56 12 100 % - -  09/23/15 1247 131/66 mmHg - - (!) 57 12 100 % - -  09/23/15 1232 129/65 mmHg - - (!) 58 11 100 % - -  09/23/15 1217 126/74 mmHg - - 65 (!) 8 100 % - -  09/23/15 1202 126/65 mmHg - - 75 12 100 % - -  09/23/15 1147 124/67 mmHg - - 74 10 100 % - -  09/23/15 1132 126/62 mmHg - - 80 13 100 % - -  09/23/15 1117 130/72 mmHg - - 88 (!) 24 100 % - -  09/23/15 1116 - 97.5 F (36.4 C) - - - - - -   09/23/15 [id number] (!) 150/64 mmHg 97.8 F (36.6 C) Oral 60 18 98 % 5\' 4"  (1.626 m) 101.152 kg (223 lb)    @flow {1959:LAST@   Intake/Output from previous day:   01/02 0701 - 01/03 0700 In: 1560 [P.O.:60; I.V.:1500] Out: 400 [Urine:300]   Intake/Output this shift:       Intake/Output      01/02 0701 - 01/03 0700 01/03 0701 - 01/04 0700   P.O. 60    I.V. (mL/kg) 1500 (14.8)    Total Intake(mL/kg) 1560 (15.4)    Urine (  mL/kg/hr) 300    Blood 100    Total Output 400     Net +1160          Urine Occurrence 3 x       LABORATORY DATA:  Recent Labs  09/23/15 1736 09/24/15 0458  WBC 8.0 10.6*  HGB 12.5 11.5*  HCT 38.5 36.7  PLT 200 196    Recent Labs  09/23/15 1736 09/24/15 0458  NA  --  137  K  --  3.9  CL  --  105  CO2  --  22  BUN  --  13  CREATININE 0.84 1.07*  GLUCOSE  --  133*  CALCIUM  --  8.5*   Lab Results  Component Value Date   INR 1.15 09/13/2015   INR 1.05 01/10/2013    Examination:  General appearance: alert, cooperative and no distress Extremities: Homans sign is negative, no sign of DVT  Wound Exam: clean, dry, intact   Drainage:  None: wound tissue dry  Motor Exam: Quadriceps and Hamstrings Intact  Sensory Exam: Deep Peroneal normal   Assessment:    1 Day Post-Op  Procedure(s) (LRB): LEFT TOTAL KNEE ARTHROPLASTY (Left)  ADDITIONAL DIAGNOSIS:  Active Problems:   S/P total knee arthroplasty  Acute Blood Loss Anemia   Plan: Physical Therapy as ordered Weight Bearing as Tolerated (WBAT)  DVT Prophylaxis:  Lovenox  DISCHARGE PLAN: Home  DISCHARGE NEEDS: HHPT, CPM, Walker and 3-in-1 comode seat         Elizabeth Singh 09/24/2015, 7:04 AM

## 2015-09-24 NOTE — Care Management Note (Signed)
Case Management Note  Patient Details  Name: Elizabeth Singh MRN: [id number] Date of Birth: 06-04-1951  Subjective/Objective:         S/p left total knee arthroplasty           Action/Plan: Set up with [facility] for HHPT by MD office but notified by [facility] that they are not in network with patient's insurance. Spoke with patient about HH, she selected [facility]. Contacted Katie at [facility] and set up [place]. Patient stated that she has a rolling walker and 3N1 from previous surgery and CPM has been delivered to home by [facility]. Patient stated that here daughter will be available to assist her after discharge.   Expected Discharge Date:                  Expected Discharge Plan:  [place]  In-House Referral:  NA  Discharge planning Services  CM Consult  Post Acute Care Choice:  Durable Medical Equipment, Home Health Choice offered to:  Patient  DME Arranged:  CPM DME Agency:  [facility]  HH Arranged:  PT [place] Agency:  [facility]  Status of Service:  Completed, signed off  Medicare Important Message Given:    Date Medicare IM Given:    Medicare IM give by:    Date Additional Medicare IM Given:    Additional Medicare Important Message give by:     If discussed at [place] of Stay Meetings, dates discussed:    Additional Comments:  Nila Nephew, RN 09/24/2015, 11:10 AM

## 2015-09-24 NOTE — Op Note (Signed)
TOTAL KNEE REPLACEMENT OPERATIVE NOTE:  09/23/2015  9:05 AM  PATIENT:  Elizabeth Singh  65 y.o. female  PRE-OPERATIVE DIAGNOSIS:  primary osteoarthritis left knee  POST-OPERATIVE DIAGNOSIS:  primary osteoarthritis left knee  PROCEDURE:  Procedure(s): LEFT TOTAL KNEE ARTHROPLASTY  SURGEON:  Surgeon(s): Vickey Huger, MD  PHYSICIAN ASSISTANT: Carlynn Spry, [facility]  ANESTHESIA:   spinal  DRAINS: Hemovac  SPECIMEN: None  COUNTS:  Correct  TOURNIQUET:   Total Tourniquet Time Documented: Thigh (Right) - 50 minutes Total: Thigh (Right) - 50 minutes   DICTATION:  Indication for procedure:    The patient is a 65 y.o. female who has failed conservative treatment for primary osteoarthritis left knee.  Informed consent was obtained prior to anesthesia. The risks versus benefits of the operation were explain and in a way the patient can, and did, understand.   On the implant demand matching protocol, this patient scored 8.  Therefore, this patient did" "did not receive a polyethylene insert with vitamin E which is a high demand implant.  Description of procedure:     The patient was taken to the operating room and placed under anesthesia.  The patient was positioned in the usual fashion taking care that all body parts were adequately padded and/or protected.  I foley catheter was not placed.  A tourniquet was applied and the leg prepped and draped in the usual sterile fashion.  The extremity was exsanguinated with the esmarch and tourniquet inflated to 350 mmHg.  Pre-operative range of motion was normal.  The knee was in 5 degree of mild varus.  A midline incision approximately 6-7 inches long was made with a #10 blade.  A new blade was used to make a parapatellar arthrotomy going 2-3 cm into the quadriceps tendon, over the patella, and alongside the medial aspect of the patellar tendon.  A synovectomy was then performed with the #10 blade and forceps. I then elevated the deep MCL off the  medial tibial metaphysis subperiosteally around to the semimembranosus attachment.    I everted the patella and used calipers to measure patellar thickness.  I used the reamer to ream down to appropriate thickness to recreate the native thickness.  I then removed excess bone with the rongeur and sagittal saw.  I used the appropriately sized template and drilled the three lug holes.  I then put the trial in place and measured the thickness with the calipers to ensure recreation of the native thickness.  The trial was then removed and the patella subluxed and the knee brought into flexion.  A homan retractor was place to retract and protect the patella and lateral structures.  A Z-retractor was place medially to protect the medial structures.  The extra-medullary alignment system was used to make cut the tibial articular surface perpendicular to the anamotic axis of the tibia and in 3 degrees of posterior slope.  The cut surface and alignment jig was removed.  I then used the intramedullary alignment guide to make a 6 valgus cut on the distal femur.  I then marked out the epicondylar axis on the distal femur.  The posterior condylar axis measured 3 degrees.  I then used the anterior referencing sizer and measured the femur to be a size 8.  The 4-In-1 cutting block was screwed into place in external rotation matching the posterior condylar angle, making our cuts perpendicular to the epicondylar axis.  Anterior, posterior and chamfer cuts were made with the sagittal saw.  The cutting block and cut pieces  were removed.  A lamina spreader was placed in 90 degrees of flexion.  The ACL, PCL, menisci, and posterior condylar osteophytes were removed.  A 16 mm spacer blocked was found to offer good flexion and extension gap balance after mild in degree releasing.   The scoop retractor was then placed and the femoral finishing block was pinned in place.  The small sagittal saw was used as well as the lug drill to finish  the femur.  The block and cut surfaces were removed and the medullary canal hole filled with autograft bone from the cut pieces.  The tibia was delivered forward in deep flexion and external rotation.  A size D tray was selected and pinned into place centered on the medial 1/3 of the tibial tubercle.  The reamer and keel was used to prepare the tibia through the tray.    I then trialed with the size 8 femur, size D tibia, a 16 mm insert and the 32 patella.  I had excellent flexion/extension gap balance, excellent patella tracking.  Flexion was full and beyond 120 degrees; extension was zero.  These components were chosen and the staff opened them to me on the back table while the knee was lavaged copiously and the cement mixed.  The soft tissue was infiltrated with 60cc of exparel 1.3% through a 21 gauge needle.  I cemented in the components and removed all excess cement.  The polyethylene tibial component was snapped into place and the knee placed in extension while cement was hardening.  The capsule was infilltrated with 30cc of .25% Marcaine with epinephrine.  A hemovac was place in the joint exiting superolaterally.  A pain pump was place superomedially superficial to the arthrotomy.  Once the cement was hard, the tourniquet was let down.  Hemostasis was obtained.  The arthrotomy was closed with figure-8 #1 vicryl sutures.  The deep soft tissues were closed with #0 vicryls and the subcuticular layer closed with a running #2-0 vicryl.  The skin was reapproximated and closed with skin staples.  The wound was dressed with xeroform, 4 x4's, 2 ABD sponges, a single layer of webril and a TED stocking.   The patient was then awakened, extubated, and taken to the recovery room in stable condition.  BLOOD LOSS:  300cc DRAINS: 1 hemovac, 1 pain catheter COMPLICATIONS:  None.  PLAN OF CARE: Admit to inpatient   PATIENT DISPOSITION:  PACU - hemodynamically stable.   Delay start of Pharmacological VTE agent  (>24hrs) due to surgical blood loss or risk of bleeding:  not applicable  Please fax a copy of this op note to my office at [phone] (please only include page 1 and 2 of the Case Information op note)

## 2015-09-25 LAB — GLUCOSE, CAPILLARY: GLUCOSE-CAPILLARY: 99 mg/dL (ref 65–99)

## 2015-09-25 LAB — CBC
HEMATOCRIT: 36.3 % (ref 36.0–46.0)
HEMOGLOBIN: 11.5 g/dL — AB (ref 12.0–15.0)
MCH: 28.9 pg (ref 26.0–34.0)
MCHC: 31.7 g/dL (ref 30.0–36.0)
MCV: 91.2 fL (ref 78.0–100.0)
Platelets: 212 10*3/uL (ref 150–400)
RBC: 3.98 MIL/uL (ref 3.87–5.11)
RDW: 14.2 % (ref 11.5–15.5)
WBC: 11.4 10*3/uL — ABNORMAL HIGH (ref 4.0–10.5)

## 2015-09-25 MED ORDER — OXYCODONE HCL 5 MG PO TABS
5.0000 mg | ORAL_TABLET | ORAL | Status: DC | PRN
  Administered 2015-09-25 – 2015-09-26 (×5): 10 mg via ORAL
  Filled 2015-09-25 (×5): qty 2

## 2015-09-25 NOTE — Progress Notes (Signed)
Physical Therapy Treatment Patient Details Name: Elizabeth Singh MRN: [id number] DOB: 02-09-1951 Today's Date: 09/25/2015    History of Present Illness pt admitted for L TKA. PMHx: R TKA, HTN, arthritis, glaucoma    PT Comments    Pt with setback this morning in mentation and pain but progressing now with mobility, ambulated 43' with RW and min A. Did mention while walking that it looked like to floor was moving. All mobility very slow today with increased time to process, partly from prior event but partly because pt wanting to try each step on her own before accepting assistance. PT will continue to follow.   Follow Up Recommendations  Home health PT     Equipment Recommendations  None recommended by PT    Recommendations for Other Services       Precautions / Restrictions Precautions Precautions: Knee;Fall Restrictions Weight Bearing Restrictions: Yes LLE Weight Bearing: Weight bearing as tolerated    Mobility  Bed Mobility Overal bed mobility: Needs Assistance Bed Mobility: Sit to Supine       Sit to supine: Min assist   General bed mobility comments: pt requires excess time for all mobility today due to pain and lethargy. Did not want any assist getting into bed but was unable to manage LLE on her own so accepted min A to LLE to get to supine and was then able to use RLE to scoot self over in bed with vc's  Transfers Overall transfer level: Needs assistance Equipment used: Rolling walker (2 wheeled) Transfers: Sit to/from Stand Sit to Stand: Mod assist;Min assist         General transfer comment: mod A from recliner, 2 practices, min A from [facility] over toilet. Pt has difficulty with fwd wt shift with sit to stand. vc's for technique   Ambulation/Gait Ambulation/Gait assistance: Min assist Ambulation Distance (Feet): 80 Feet Assistive device: Rolling walker (2 wheeled) Gait Pattern/deviations: Step-through pattern;Decreased weight shift to right;Decreased stance  time - left Gait velocity: decreased Gait velocity interpretation: Below normal speed for age/gender General Gait Details: vc's for beginning to pass left foot with right foot and posture   Stairs            Wheelchair Mobility    Modified Rankin (Stroke Patients Only)       Balance Overall balance assessment: Needs assistance Sitting-balance support: Feet supported Sitting balance-Leahy Scale: Good     Standing balance support: No upper extremity supported Standing balance-Leahy Scale: Fair Standing balance comment: able to stand statically without UE support but requires support for stepping                    Cognition Arousal/Alertness: Lethargic;Suspect due to medications Behavior During Therapy: [facility] for tasks assessed/performed Overall Cognitive Status: Within Functional Limits for tasks assessed (once aroused)                      Exercises Total Joint Exercises Ankle Circles/Pumps: AROM;Both;10 reps;Seated Quad Sets: AROM;Left;Seated;10 reps Heel Slides: AAROM;Left;Seated;5 reps Hip ABduction/ADduction: AAROM;Left;10 reps;Seated Straight Leg Raises: AAROM;Left;Seated;5 reps Long Arc Quad: AAROM;Left;10 reps;Seated Goniometric ROM: 10-55    General Comments General comments (skin integrity, edema, etc.): Left in CPM 0-55 degrees. pt improving in mental state throughout session but is still processing slowly      Pertinent Vitals/Pain Pain Assessment: 0-10 Pain Score: 5  Pain Location: left knee Pain Descriptors / Indicators: Aching;Burning Pain Intervention(s): Limited activity within patient's tolerance;Monitored during session    Home  Living                      Prior Function            PT Goals (current goals can now be found in the care plan section) Acute Rehab PT Goals Patient Stated Goal: return home PT Goal Formulation: With patient/family Time For Goal Achievement: 10/01/15 Potential to Achieve Goals:  Good Progress towards PT goals: Progressing toward goals    Frequency  7X/week    PT Plan Current plan remains appropriate    Co-evaluation             End of Session Equipment Utilized During Treatment: Gait belt Activity Tolerance: Patient tolerated treatment well Patient left: with call bell/phone within reach;with family/visitor present;in bed;in CPM     Time: [id number] PT Time Calculation (min) (ACUTE ONLY): 59 min  Charges:  $Gait Training: 8-22 mins $Therapeutic Exercise: 8-22 mins $Therapeutic Activity: 23-37 mins                    G Codes:     Leighton Roach, PT  Acute Rehab Services  [place], [place] 09/25/2015, 1:50 PM

## 2015-09-25 NOTE — Progress Notes (Signed)
Physical Therapy Treatment Patient Details Name: Elizabeth Singh MRN: [id number] DOB: 25-May-1951 Today's Date: 09/25/2015    History of Present Illness pt admitted for L TKA. PMHx: R TKA, HTN, arthritis, glaucoma    PT Comments    Pt mentation much better this afternoon but continues to have some visual/ depth disturbance when up. Ambulated 90' with RW and min-guard A for safety. PT will continue to follow.   Follow Up Recommendations  Home health PT     Equipment Recommendations  None recommended by PT    Recommendations for Other Services       Precautions / Restrictions Precautions Precautions: Knee;Fall Restrictions Weight Bearing Restrictions: Yes LLE Weight Bearing: Weight bearing as tolerated    Mobility  Bed Mobility Overal bed mobility: Needs Assistance Bed Mobility: Sit to Supine;Supine to Sit     Supine to sit: Supervision Sit to supine: Min assist   General bed mobility comments: pt able to get to EOB without physical assist but attempted multiple times to return to bed independently and was unable to do so without min A to LLE. Increased time given  Transfers Overall transfer level: Needs assistance Equipment used: Rolling walker (2 wheeled) Transfers: Sit to/from Stand Sit to Stand: Min assist         General transfer comment: min A from bed and recliner with vc's for fwd wt shift  Ambulation/Gait Ambulation/Gait assistance: Supervision;Min guard Ambulation Distance (Feet): 60 Feet Assistive device: Rolling walker (2 wheeled) Gait Pattern/deviations: Step-through pattern;Decreased weight shift to left Gait velocity: decreased Gait velocity interpretation: Below normal speed for age/gender General Gait Details: pt began to feel like "floor was moving" again after about 30' so returned to room at that point.    Stairs            Wheelchair Mobility    Modified Rankin (Stroke Patients Only)       Balance Overall balance assessment:  Needs assistance Sitting-balance support: Feet supported Sitting balance-Leahy Scale: Good     Standing balance support: No upper extremity supported Standing balance-Leahy Scale: Fair Standing balance comment: able to stand statically without UE support but requires support for stepping                    Cognition Arousal/Alertness: Awake/alert Behavior During Therapy: WFL for tasks assessed/performed Overall Cognitive Status: Within Functional Limits for tasks assessed (once aroused)                      Exercises Total Joint Exercises Ankle Circles/Pumps: AROM;Both;10 reps;Seated Quad Sets: AROM;Left;Seated;10 reps Heel Slides: AAROM;Left;10 reps;Supine Hip ABduction/ADduction: AAROM;Left;10 reps;Supine Straight Leg Raises: AAROM;Left;10 reps;Supine Long Arc Quad: AAROM;Left;10 reps;Seated Goniometric ROM: 10-55    General Comments General comments (skin integrity, edema, etc.): Left in CPM 0-55 degrees. pt improving in mental state throughout session but is still processing slowly      Pertinent Vitals/Pain Pain Assessment: Faces Pain Score: 5  Faces Pain Scale: Hurts little more Pain Location: left knee Pain Descriptors / Indicators: Sore Pain Intervention(s): Limited activity within patient's tolerance;Monitored during session    Home Living                      Prior Function            PT Goals (current goals can now be found in the care plan section) Acute Rehab PT Goals Patient Stated Goal: return home PT Goal Formulation: With patient/family Time For Goal  Achievement: 10/01/15 Potential to Achieve Goals: Good Progress towards PT goals: Progressing toward goals    Frequency  7X/week    PT Plan Current plan remains appropriate    Co-evaluation             End of Session Equipment Utilized During Treatment: Gait belt Activity Tolerance: Patient tolerated treatment well Patient left: with call bell/phone within  reach;in bed     Time: [id number] PT Time Calculation (min) (ACUTE ONLY): 41 min  Charges:  $Gait Training: 8-22 mins $Therapeutic Exercise: 8-22 mins $Therapeutic Activity: 23-37 mins                    G Codes:     Leighton Roach, PT  Acute Rehab Services  [phone]  Leighton Roach 09/25/2015, 5:03 PM

## 2015-09-25 NOTE — Progress Notes (Signed)
Occupational Therapy Treatment Patient Details Name: Elizabeth Singh MRN: [id number] DOB: Apr 24, 1951 Today's Date: 09/25/2015    History of present illness pt admitted for L TKA. PMHx: R TKA, HTN, arthritis, glaucoma   OT comments  Pt limited by pain and lethargy, suspect due to medication, this session but agreeable to participate in therapy. Pt currently mod assist for sit to stand from chair and BSC. Min guard for functional mobility ambulating to bathroom, toilet hygiene and clothing manipulation, and grooming in standing. Educated on tub transfer technique; unable to practice this session secondary to pain and lethargy. Will continue to follow acutely.    Follow Up Recommendations  No OT follow up;Supervision/Assistance - 24 hour    Equipment Recommendations  None recommended by OT    Recommendations for Other Services      Precautions / Restrictions Precautions Precautions: Knee;Fall Restrictions Weight Bearing Restrictions: Yes LLE Weight Bearing: Weight bearing as tolerated       Mobility Bed Mobility               General bed mobility comments: Pt OOB in chair  Transfers Overall transfer level: Needs assistance Equipment used: Rolling walker (2 wheeled) Transfers: Sit to/from Stand Sit to Stand: Mod assist         General transfer comment: Mod assist to boost up from chair/BSC. VC for technique and sequencing. Min guard for functional mobility to bathroom    Balance Overall balance assessment: Needs assistance         Standing balance support: No upper extremity supported;During functional activity Standing balance-Leahy Scale: Fair Standing balance comment: Able to stand at sink and wash hands without UE support                   ADL Overall ADL's : Needs assistance/impaired     Grooming: Min guard;Standing;Wash/dry Geophysical data processor Transfer: Moderate assistance;Ambulation;BSC;RW;Cueing for sequencing (BSC over  toilet) Toilet Transfer Details (indicate cue type and reason): Mod assist to boost up from chair/BSC over toilet. Min guard for ambulation to bathroom. Toileting- Water quality scientist and Hygiene: Min guard;Sit to/from stand (for toilet hygiene and clothing manipulation)     Tub/Shower Transfer Details (indicate cue type and reason): Educated on tub transfer technique; pt unable to practice at this time secondary to pain and lethargy, suspect due to medication. Discussed performing dry run of tub transfer with home health therapist prior to attempting on own if OT is unable to practice in hospital; pt and daughter verbalize understanding. Functional mobility during ADLs: Min guard;Rolling walker General ADL Comments: Pts daughter present for OT session. Educated on home safety, need for supervision during ADLs and functional mobility. Pts daughter reports that this morning has been difficulty; pt is moving slower than yesterday; they think it is medication related and PA-C is aware and adjusting medication.      Vision                     Perception     Praxis      Cognition   Behavior During Therapy: [facility] for tasks assessed/performed Overall Cognitive Status: Within Functional Limits for tasks assessed                       Extremity/Trunk Assessment               Exercises  Shoulder Instructions       General Comments      Pertinent Vitals/ Pain       Pain Assessment: 0-10 Pain Score: 4  Pain Location: L knee Pain Descriptors / Indicators: Aching;Grimacing;Sore Pain Intervention(s): Limited activity within patient's tolerance;Monitored during session;Ice applied  Home Living                                          Prior Functioning/Environment              Frequency Min 2X/week     Progress Toward Goals  OT Goals(current goals can now be found in the care plan section)  Progress towards OT goals: Not progressing  toward goals - comment (pt limited by pain and lethargy)  Acute Rehab OT Goals Patient Stated Goal: none stated  Plan Discharge plan remains appropriate    Co-evaluation                 End of Session Equipment Utilized During Treatment: Gait belt;Rolling walker   Activity Tolerance Patient limited by lethargy;Patient limited by pain   Patient Left in chair;with call bell/phone within reach;with family/visitor present   Nurse Communication          Time: [id number] OT Time Calculation (min): 35 min  Charges: OT General Charges $OT Visit: 1 Procedure OT Treatments $Self Care/Home Management : 23-37 mins  Binnie Kand M.S., OTR/L Pager: [phone]  09/25/2015, 10:31 AM

## 2015-09-25 NOTE — Progress Notes (Signed)
SPORTS MEDICINE AND JOINT REPLACEMENT  [name], MD   Carlynn Spry, PA-C [place], [place], [place]  [address]                             [phone]   PROGRESS NOTE  Subjective:  negative for Chest Pain  negative for Shortness of Breath  negative for Nausea/Vomiting   negative for Calf Pain  negative for Bowel Movement   Tolerating Diet: yes         Patient reports pain as 4 on 0-10 scale.    Objective: Vital signs in last 24 hours:   Patient Vitals for the past 24 hrs:  BP Temp Temp src Pulse Resp SpO2  09/25/15 0645 135/64 mmHg 98.9 F (37.2 C) Oral 91 18 97 %  09/24/15 1957 (!) 160/80 mmHg 98.4 F (36.9 C) Oral 93 18 99 %    @flow {1959:LAST@   Intake/Output from previous day:   01/03 0701 - 01/04 0700 In: 330 [P.O.:330] Out: -    Intake/Output this shift:       Intake/Output      01/03 0701 - 01/04 0700 01/04 0701 - 01/05 0700   P.O. 330    I.V. (mL/kg)     Total Intake(mL/kg) 330 (3.3)    Urine (mL/kg/hr)     Blood     Total Output       Net +330          Urine Occurrence 4 x 2 x      LABORATORY DATA:  Recent Labs  09/23/15 1736 09/24/15 0458 09/25/15 0529  WBC 8.0 10.6* 11.4*  HGB 12.5 11.5* 11.5*  HCT 38.5 36.7 36.3  PLT 200 196 212    Recent Labs  09/23/15 1736 09/24/15 0458  NA  --  137  K  --  3.9  CL  --  105  CO2  --  22  BUN  --  13  CREATININE 0.84 1.07*  GLUCOSE  --  133*  CALCIUM  --  8.5*   Lab Results  Component Value Date   INR 1.15 09/13/2015   INR 1.05 01/10/2013    Examination:  General appearance: alert, cooperative and no distress Extremities: extremities normal, atraumatic, no cyanosis or edema and Homans sign is negative, no sign of DVT  Wound Exam: clean, dry, intact   Drainage:  None: wound tissue dry  Motor Exam: Quadriceps and Hamstrings Intact  Sensory Exam: Deep Peroneal normal   Assessment:    2 Days Post-Op  Procedure(s) (LRB): LEFT TOTAL KNEE ARTHROPLASTY  (Left)  ADDITIONAL DIAGNOSIS:  Active Problems:   S/P total knee arthroplasty  Acute Blood Loss Anemia   Plan: Physical Therapy as ordered Weight Bearing as Tolerated (WBAT)  DVT Prophylaxis:  Lovenox  DISCHARGE PLAN: Home  DISCHARGE NEEDS: HHPT, CPM, Walker and 3-in-1 comode seat         Ancelmo Hunt 09/25/2015, 3:03 PM

## 2015-09-26 ENCOUNTER — Encounter: Payer: Self-pay | Admitting: Orthopedic Surgery

## 2015-09-26 LAB — CBC
HEMATOCRIT: 31.1 % — AB (ref 36.0–46.0)
Hemoglobin: 10.1 g/dL — ABNORMAL LOW (ref 12.0–15.0)
MCH: 29.5 pg (ref 26.0–34.0)
MCHC: 32.5 g/dL (ref 30.0–36.0)
MCV: 90.9 fL (ref 78.0–100.0)
Platelets: 167 10*3/uL (ref 150–400)
RBC: 3.42 MIL/uL — ABNORMAL LOW (ref 3.87–5.11)
RDW: 14.1 % (ref 11.5–15.5)
WBC: 10.3 10*3/uL (ref 4.0–10.5)

## 2015-09-26 NOTE — Care Management Important Message (Signed)
Important Message  Patient Details  Name: Elizabeth Singh MRN: [id number] Date of Birth: 09-05-1951   Medicare Important Message Given:  Yes    Auren Valdes P Taurean Ju 09/26/2015, 1:54 PM

## 2015-09-26 NOTE — Progress Notes (Signed)
Pt ready for discharge. Belongings gathered and IV removed. Instructions/education reviewed with patient and daughter and all questions/concerns addressed. Pt will be transported out via wheelchair to daughter's car. Will continue to monitor.

## 2015-09-26 NOTE — Progress Notes (Signed)
Patient ID: Maryan Puls, female   DOB: Feb 12, 1951, 65 y.o.   MRN: [id number] PATIENT ID:      English Acker  MRN:     [id number] DOB/AGE:    1951-08-11 / 65 y.o.                                             _____________________________           Lenore Manner ASSISTANT NOTE        I assisted Surgeon:  [name], MD   Left total knee arthroplasty on 09/23/2015.    I provided my assistance on this case for assistance with exposure, retraction, bleeding    control, instrumentation, and closure         Electronicallly signed by [place] Caisen Mangas 09/26/2015  9:43 AM

## 2015-09-26 NOTE — Progress Notes (Signed)
Physical Therapy Treatment Patient Details Name: Elizabeth Singh MRN: [id number] DOB: 07/25/1951 Today's Date: 09/26/2015    History of Present Illness pt admitted for L TKA. PMHx: R TKA, HTN, arthritis, glaucoma    PT Comments    [name] demonstrates improved ability with transfers and bed mobility. Limited tolerance for gait continues due to pain and visual disturbance of the floor moving. Pt with very limited knee ROM tolerating 60degrees on CPM on arrival but limited to 40 with AAROM. Educated for importance of knee ROM and activity with [name] and pt aware. Will continue to follow but sufficient mobility for return home at this time.   Follow Up Recommendations  Home health PT     Equipment Recommendations       Recommendations for Other Services       Precautions / Restrictions Precautions Precautions: Knee;Fall Restrictions LLE Weight Bearing: Weight bearing as tolerated    Mobility  Bed Mobility Overal bed mobility: Modified Independent             General bed mobility comments: increased time with bed flat and no rails, RLE assisting LLE at times  Transfers Overall transfer level: Needs assistance   Transfers: Sit to/from Stand Sit to Stand: Supervision         General transfer comment: supervision for safety, no cues needed from bed and [facility]  Ambulation/Gait Ambulation/Gait assistance: Supervision Ambulation Distance (Feet): 100 Feet Assistive device: Rolling walker (2 wheeled) Gait Pattern/deviations: Step-through pattern;Decreased stride length;Decreased stance time - left;Decreased dorsiflexion - left   Gait velocity interpretation: Below normal speed for age/gender General Gait Details: pt with increased gait tolerance and ability in stance. pt able to stand foot flat on LLE with cues for increased heel strike. Pt again stating that she feels the lines on the floor are moving with gait, no change with gaze fixation   Stairs             Wheelchair Mobility    Modified Rankin (Stroke Patients Only)       Balance                                    Cognition Arousal/Alertness: Awake/alert Behavior During Therapy: WFL for tasks assessed/performed Overall Cognitive Status: Within Functional Limits for tasks assessed                      Exercises Total Joint Exercises Quad Sets: AROM;Left;10 reps;Supine Heel Slides: AAROM;Left;10 reps;Supine Hip ABduction/ADduction: AROM;Left;15 reps;Supine Straight Leg Raises: AAROM;Left;15 reps;Supine Goniometric ROM: 12-40    General Comments        Pertinent Vitals/Pain Pain Score: 5  Pain Location: left knee Pain Descriptors / Indicators: Aching;Sore Pain Intervention(s): Limited activity within patient's tolerance;Monitored during session;Repositioned;Patient requesting pain meds-RN notified;RN gave pain meds during session    Home Living                      Prior Function            PT Goals (current goals can now be found in the care plan section) Progress towards PT goals: Progressing toward goals    Frequency       PT Plan Current plan remains appropriate    Co-evaluation             End of Session   Activity Tolerance: Patient tolerated treatment well Patient left: in chair;with  call bell/phone within reach;with family/visitor present     Time: 0752-0831 PT Time Calculation (min) (ACUTE ONLY): 39 min  Charges:  $Gait Training: 8-22 mins $Therapeutic Exercise: 8-22 mins $Therapeutic Activity: 8-22 mins                    G Codes:      [name] 10/20/2015, 10:06 AM Elwyn Reach, [place]

## 2015-10-06 ENCOUNTER — Emergency Department (HOSPITAL_BASED_OUTPATIENT_CLINIC_OR_DEPARTMENT_OTHER)
Admission: EM | Admit: 2015-10-06 | Discharge: 2015-10-07 | Disposition: A | Discharge status: Home / Self Care | Payer: Medicare HMO | Attending: Emergency Medicine | Admitting: Emergency Medicine

## 2015-10-06 ENCOUNTER — Encounter (HOSPITAL_BASED_OUTPATIENT_CLINIC_OR_DEPARTMENT_OTHER): Payer: Self-pay | Admitting: Emergency Medicine

## 2015-10-06 DIAGNOSIS — X58XXXA Exposure to other specified factors, initial encounter: Secondary | ICD-10-CM | POA: Insufficient documentation

## 2015-10-06 DIAGNOSIS — T783XXA Angioneurotic edema, initial encounter: Secondary | ICD-10-CM | POA: Insufficient documentation

## 2015-10-06 DIAGNOSIS — Z8719 Personal history of other diseases of the digestive system: Secondary | ICD-10-CM | POA: Insufficient documentation

## 2015-10-06 DIAGNOSIS — I10 Essential (primary) hypertension: Secondary | ICD-10-CM | POA: Diagnosis not present

## 2015-10-06 DIAGNOSIS — R220 Localized swelling, mass and lump, head: Secondary | ICD-10-CM | POA: Diagnosis present

## 2015-10-06 DIAGNOSIS — Y9389 Activity, other specified: Secondary | ICD-10-CM | POA: Diagnosis not present

## 2015-10-06 DIAGNOSIS — Y998 Other external cause status: Secondary | ICD-10-CM | POA: Insufficient documentation

## 2015-10-06 DIAGNOSIS — Z79899 Other long term (current) drug therapy: Secondary | ICD-10-CM | POA: Diagnosis not present

## 2015-10-06 DIAGNOSIS — Z87891 Personal history of nicotine dependence: Secondary | ICD-10-CM | POA: Insufficient documentation

## 2015-10-06 DIAGNOSIS — Y9289 Other specified places as the place of occurrence of the external cause: Secondary | ICD-10-CM | POA: Diagnosis not present

## 2015-10-06 DIAGNOSIS — Z9889 Other specified postprocedural states: Secondary | ICD-10-CM | POA: Diagnosis not present

## 2015-10-06 DIAGNOSIS — Z8639 Personal history of other endocrine, nutritional and metabolic disease: Secondary | ICD-10-CM | POA: Insufficient documentation

## 2015-10-06 DIAGNOSIS — Z87440 Personal history of urinary (tract) infections: Secondary | ICD-10-CM | POA: Diagnosis not present

## 2015-10-06 DIAGNOSIS — H409 Unspecified glaucoma: Secondary | ICD-10-CM | POA: Insufficient documentation

## 2015-10-06 DIAGNOSIS — M1990 Unspecified osteoarthritis, unspecified site: Secondary | ICD-10-CM | POA: Insufficient documentation

## 2015-10-06 MED ORDER — METHYLPREDNISOLONE SODIUM SUCC 125 MG IJ SOLR
125.0000 mg | Freq: Once | INTRAMUSCULAR | Status: AC
  Administered 2015-10-06: 125 mg via INTRAVENOUS
  Filled 2015-10-06: qty 2

## 2015-10-06 MED ORDER — FAMOTIDINE IN NACL 20-0.9 MG/50ML-% IV SOLN
20.0000 mg | Freq: Once | INTRAVENOUS | Status: AC
Start: 2015-10-06 — End: 2015-10-06
  Administered 2015-10-06: 20 mg via INTRAVENOUS
  Filled 2015-10-06: qty 50

## 2015-10-06 MED ORDER — DIPHENHYDRAMINE HCL 50 MG/ML IJ SOLN
25.0000 mg | Freq: Once | INTRAMUSCULAR | Status: AC
  Administered 2015-10-06: 25 mg via INTRAVENOUS
  Filled 2015-10-06: qty 1

## 2015-10-06 NOTE — ED Provider Notes (Signed)
CSN: [id number]     Arrival date & time 10/06/15  2154 History  By signing my name below, I, Elizabeth Singh, attest that this documentation has been prepared under the direction and in the presence of Elizabeth Essex, MD. Electronically Signed: [name], ED Scribe. 10/06/2015. 10:39 PM.    Chief Complaint  Patient presents with  . Facial Swelling   The history is provided by the patient. No language interpreter was used.    HPI Comments: [name] is a 65 y.o. female who presents to the Emergency Department complaining of worsening, mild, lip swelling onset at 6:00 PM with associated, mild, numbness of her lips. She notes eating sweet potato casserole with pecans prior to the onset of her symptoms noting she had the same meal recently with no issue. She was given 25 mg benadryl at 6:00 PM and notes it somewhat slowed the worsening swelling. She also notes a mild rash to her gluteal region which began yesterday with associated itchiness. Pt confirms her listed allergies. She confirms currently taking amiloride, Lovenox, oxycodone, robaxin and lopressor as prescribed. Pt denies beginning any new medications. Pt notes a SHx including left knee replacement at [facility] on 1/2 and has a follow up appointment with her surgeon next week. Pt denies any hx of DM. She denies tongue or throat swelling, SOB, dysphagia, CP or any other associated symptoms at this time.      Past Medical History  Diagnosis Date  . PONV (postoperative nausea and vomiting)     once post surgery  . Hypertension   . High cholesterol   . Arthritis   . Joint pain   . GERD (gastroesophageal reflux disease)   . H/O hiatal hernia   . Thyroid disorder     benign goiter  . Glaucoma   . PVC (premature ventricular contraction)     hx of - 2002 potassium was low  . Hx: UTI (urinary tract infection)     2011 - frequent uti's (after stopping water aerobics the uti's stopped)  . Headache     hx of migraines (none in  years)  . Fatty liver   . Diverticulosis    Past Surgical History  Procedure Laterality Date  . Cholecystectomy  1974  . Tubal ligation  1978  . Breast surgery  2002    left benign lumpectomy  . Knee arthroscopy  2001    right  . Cardiac catheterization  2002  . Total knee arthroplasty Right 01/23/2013    Procedure: TOTAL KNEE ARTHROPLASTY;  Surgeon: Vickey Huger, MD;  Location: [place];  Service: Orthopedics;  Laterality: Right;  . Colonoscopy    . Appendectomy    . Total knee arthroplasty Left 09/23/2015    Procedure: LEFT TOTAL KNEE ARTHROPLASTY;  Surgeon: Vickey Huger, MD;  Location: [place];  Service: Orthopedics;  Laterality: Left;   Family History  Problem Relation Age of Onset  . Kidney failure Mother   . Hypertension Mother    Social History  Substance Use Topics  . Smoking status: Former Smoker    Quit date: 09/12/1990  . Smokeless tobacco: Never Used  . Alcohol Use: Yes     Comment: occassional   OB History    No data available     Review of Systems A complete 10 system review of systems was obtained and all systems are negative except as noted in the HPI and PMH.   Allergies  Sulfa antibiotics  Home Medications   Prior to Admission  medications   Medication Sig Start Date End Date Taking? Authorizing Provider  amiloride-hydrochlorothiazide (MODURETIC) 5-50 MG tablet Take 0.5 tablets by mouth daily.    Historical Provider, MD  diphenhydrAMINE (BENADRYL) 25 MG tablet Take 1 tablet (25 mg total) by mouth every 6 (six) hours. 10/07/15   Elizabeth Essex, MD  enoxaparin (LOVENOX) 40 MG/0.4ML injection Inject 0.4 mLs (40 mg total) into the skin daily. 09/24/15   Elizabeth Spry, PA-C  EPINEPHrine 0.3 mg/0.3 mL IJ SOAJ injection Inject 0.3 mLs (0.3 mg total) into the muscle once. For severe allergic reaction with difficulty breathing or swallowing. 10/07/15   Elizabeth Essex, MD  famotidine (PEPCID) 20 MG tablet Take 1 tablet (20 mg total) by mouth 2 (two) times daily. 10/07/15    Elizabeth Essex, MD  latanoprost (XALATAN) 0.005 % ophthalmic solution Place 1 drop into both eyes at bedtime.    Historical Provider, MD  methocarbamol (ROBAXIN) 500 MG tablet Take 1-2 tablets (500-1,000 mg total) by mouth every 6 (six) hours as needed for muscle spasms. 09/24/15   Elizabeth Spry, PA-C  metoprolol (LOPRESSOR) 50 MG tablet Take 50 mg by mouth 2 (two) times daily.    Historical Provider, MD  oxyCODONE (OXY IR/ROXICODONE) 5 MG immediate release tablet Take 1-3 tablets (5-15 mg total) by mouth every 3 (three) hours as needed for breakthrough pain. 09/24/15   Elizabeth Spry, PA-C  oxyCODONE (OXYCONTIN) 20 mg 12 hr tablet Take 1 tablet (20 mg total) by mouth every 12 (twelve) hours. 09/24/15   Elizabeth Spry, PA-C  potassium chloride SA (K-DUR,KLOR-CON) 20 MEQ tablet Take 20 mEq by mouth every morning.    Historical Provider, MD  predniSONE (DELTASONE) 50 MG tablet 1 tablet PO daily 10/07/15   Elizabeth Essex, MD   BP 149/74 mmHg  Pulse 84  Temp(Src) 98.9 F (37.2 C) (Oral)  Resp 18  Ht 5\' 4"  (1.626 m)  Wt 223 lb (101.152 kg)  BMI 38.26 kg/m2  SpO2 94%    Physical Exam  Constitutional: She is oriented to person, place, and time. She appears well-developed and well-nourished. No distress.  HENT:  Head: Normocephalic and atraumatic.  Mouth/Throat: Oropharynx is clear and moist. No oropharyngeal exudate.  Mild swelling to upper and lower lips; no swelling of tongue or posterior pharynx  Eyes: Conjunctivae and EOM are normal. Pupils are equal, round, and reactive to light.  Neck: Normal range of motion. Neck supple.  No meningismus.  Cardiovascular: Normal rate, regular rhythm, normal heart sounds and intact distal pulses.   No murmur heard. Pulmonary/Chest: Effort normal and breath sounds normal. No respiratory distress.  Abdominal: Soft. There is no tenderness. There is no rebound and no guarding.  Musculoskeletal: Normal range of motion. She exhibits no edema or tenderness.   Neurological: She is alert and oriented to person, place, and time. No cranial nerve deficit. She exhibits normal muscle tone. Coordination normal.  No ataxia on finger to nose bilaterally. No pronator drift. 5/5 strength throughout. CN 2-12 intact.Equal grip strength. Sensation intact.   Skin: Skin is warm.  Left knee surgical incision healing well without drainage or cellulitis; Slight erythema to gluteal cleft; no fluctuance   Psychiatric: She has a normal mood and affect. Her behavior is normal.  Nursing note and vitals reviewed.   ED Course  Procedures  DIAGNOSTIC STUDIES: Oxygen Saturation is 97% on RA, normal by my interpretation.    COORDINATION OF CARE: 10:37 PM Discussed next steps with pt and she agreed to the plan.  10:53 PM Spoke with Dr. Gladstone Lighter who works with pt's surgeon and he advised there are no problems with giving the pt steroids.   Labs Review Labs Reviewed - No data to display  Imaging Review No results found. I have personally reviewed and evaluated these images and lab results as part of my medical decision-making.   EKG Interpretation None      MDM   Final diagnoses:  Angioedema, initial encounter   Lip Swelling that onset around 6 PM after eating a pecan pie around 5 PM. No difficulty breathing or swallowing. No chest pain or shortness of breath.  There is no tongue or posterior pharynx swelling. Lungs are clear. Patient is not on ACE inhibitor. Denies any other new foods or medications.  She had a left knee replacement 2 weeks ago. D/w Dr. Gladstone Lighter.  He feels there is no problem with given steroids as this point postoperatively. Her incision appears to be healing well.  Antihistamines and steroids given, patient will be monitored.  Improving on recheck.  No tongue or throat swelling. Plan continued observation with discharge on steroids course and antihistamines.  Avoid pecans in future as this could be allergic reaction and epipen will be  given.  Care transferred to dr. Stark Jock at shift change.  I personally performed the services described in this documentation, which was scribed in my presence. The recorded information has been reviewed and is accurate.    Elizabeth Essex, MD 10/07/15 [phone]

## 2015-10-06 NOTE — ED Notes (Signed)
Swelling to lips increased since triage. Acuity increased

## 2015-10-06 NOTE — ED Notes (Addendum)
Patient states that she woke up this am with some blistering to her back, the patient reports that she is also having some lip  Swelling swelling is noted to her upper and lower lips. Patient denies any trouble swallowing or breathing, scratchy throat. Denies any swelling to her tongue or throat.  Swelling worsened from 6 pm - 7pm and then worsened much slower after the benadryl 7 pm - 10 pm on arrival here.  -- the patient was given Benadryl at 6 pm 25 mg

## 2015-10-07 MED ORDER — PREDNISONE 50 MG PO TABS: ORAL_TABLET | ORAL | Status: DC

## 2015-10-07 MED ORDER — EPINEPHRINE 0.3 MG/0.3ML IJ SOAJ: 0.3000 mg | Freq: Once | INTRAMUSCULAR | Status: AC

## 2015-10-07 MED ORDER — FAMOTIDINE 20 MG PO TABS: 20.0000 mg | ORAL_TABLET | Freq: Two times a day (BID) | ORAL | Status: DC

## 2015-10-07 MED ORDER — DIPHENHYDRAMINE HCL 25 MG PO TABS: 25.0000 mg | ORAL_TABLET | Freq: Four times a day (QID) | ORAL | Status: DC

## 2015-10-07 NOTE — ED Provider Notes (Signed)
Care assumed from Dr. Wyvonnia Dusky at shift change. Patient presented here with swelling of her upper lip. This started after eating a piece of pecan casserole. She has eaten pecans in the past and never had this issue. I am uncertain as to whether this is an allergic reaction to nuts or angioedema. The patient does not take an ACE inhibitor and has never had this happen before.  Either way, she appears to be improving with the medications administered by Dr. Wyvonnia Dusky. She was reevaluated and has no swelling of the airway. The swelling to her upper lip is actually significantly improved from the time that she came in. She will be discharged with instructions take medications as prescribed and return if symptoms significantly worsen. She is also advised to avoid nuts until she follows up with an allergist for testing.  Veryl Speak, MD 10/07/15 [phone]

## 2015-10-07 NOTE — Discharge Instructions (Signed)
Angioedema Use the steroids and antihistamines as prescribed. Follow up with your doctor. Return to the ED if you develop new or worsening symptoms. Angioedema is a sudden swelling of tissues, often of the skin. It can occur on the face or genitals or in the abdomen or other body parts. The swelling usually develops over a short period and gets better in 24 to 48 hours. It often begins during the night and is found when the person wakes up. The person may also get red, itchy patches of skin (hives). Angioedema can be dangerous if it involves swelling of the air passages.  Depending on the cause, episodes of angioedema may only happen once, come back in unpredictable patterns, or repeat for several years and then gradually fade away.  CAUSES  Angioedema can be caused by an allergic reaction to various triggers. It can also result from nonallergic causes, including reactions to drugs, immune system disorders, viral infections, or an abnormal gene that is passed to you from your parents (hereditary). For some people with angioedema, the cause is unknown.  Some things that can trigger angioedema include:   Foods.   Medicines, such as ACE inhibitors, ARBs, nonsteroidal anti-inflammatory agents, or estrogen.   Latex.   Animal saliva.   Insect stings.   Dyes used in X-rays.   Mild injury.   Dental work.  Surgery.  Stress.   Sudden changes in temperature.   Exercise. SIGNS AND SYMPTOMS   Swelling of the skin.  Hives. If these are present, there is also intense itching.  Redness in the affected area.   Pain in the affected area.  Swollen lips or tongue.  Breathing problems. This may happen if the air passages swell.  Wheezing. If internal organs are involved, there may be:   Nausea.   Abdominal pain.   Vomiting.   Difficulty swallowing.   Difficulty passing urine. DIAGNOSIS   Your health care provider will examine the affected area and take a medical  and family history.  Various tests may be done to help determine the cause. Tests may include:  Allergy skin tests to see if the problem is an allergic reaction.   Blood tests to check for hereditary angioedema.   Tests to check for underlying diseases that could cause the condition.   A review of your medicines, including over-the-counter medicines, may be done. TREATMENT  Treatment will depend on the cause of the angioedema. Possible treatments include:   Removal of anything that triggered the condition (such as stopping certain medicines).   Medicines to treat symptoms or prevent attacks. Medicines given may include:   Antihistamines.   Epinephrine injection.   Steroids.   Hospitalization may be required for severe attacks. If the air passages are affected, it can be an emergency. Tubes may need to be placed to keep the airway open. HOME CARE INSTRUCTIONS   Take all medicines as directed by your health care provider.  If you were given medicines for emergency allergy treatment, always carry them with you.  Wear a medical bracelet as directed by your health care provider.   Avoid known triggers. SEEK MEDICAL CARE IF:   You have repeat attacks of angioedema.   Your attacks are more frequent or more severe despite preventive measures.   You have hereditary angioedema and are considering having children. It is important to discuss with your health care provider the risks of passing the condition on to your children. SEEK IMMEDIATE MEDICAL CARE IF:   You have severe  swelling of the mouth, tongue, or lips.  You have difficulty breathing.   You have difficulty swallowing.   You faint. MAKE SURE YOU:  Understand these instructions.  Will watch your condition.  Will get help right away if you are not doing well or get worse.   This information is not intended to replace advice given to you by your health care provider. Make sure you discuss any  questions you have with your health care provider.   Document Released: 11/16/2001 Document Revised: 09/28/2014 Document Reviewed: 05/01/2013 Elsevier Interactive Patient Education [employer].

## 2015-10-07 NOTE — ED Notes (Signed)
Offered pt assistance to car in w/c tolerated well.

## 2015-10-09 ENCOUNTER — Ambulatory Visit: Payer: Medicare HMO | Attending: Orthopedic Surgery | Admitting: Physical Therapy

## 2015-10-09 DIAGNOSIS — R29898 Other symptoms and signs involving the musculoskeletal system: Secondary | ICD-10-CM | POA: Insufficient documentation

## 2015-10-09 DIAGNOSIS — M25562 Pain in left knee: Secondary | ICD-10-CM | POA: Insufficient documentation

## 2015-10-09 DIAGNOSIS — M25662 Stiffness of left knee, not elsewhere classified: Secondary | ICD-10-CM | POA: Diagnosis present

## 2015-10-09 DIAGNOSIS — R262 Difficulty in walking, not elsewhere classified: Secondary | ICD-10-CM | POA: Diagnosis present

## 2015-10-09 DIAGNOSIS — R269 Unspecified abnormalities of gait and mobility: Secondary | ICD-10-CM | POA: Insufficient documentation

## 2015-10-09 NOTE — Therapy (Signed)
[facility] [address]  [place], [place], [address] Phone: [phone]   Fax:  [phone]  Physical Therapy Evaluation  Patient Details  Name: Elizabeth Singh MRN: [id number] Date of Birth: 1950-10-13 Referring Provider: [name], MD  Encounter Date: 10/09/2015      PT End of Session - 10/09/15 1413    Visit Number 1   Number of Visits 19   Date for PT Re-Evaluation 12/04/15   PT Start Time [id number]   PT Stop Time 1411   PT Time Calculation (min) 56 min   Activity Tolerance Patient tolerated treatment well   Behavior During Therapy [facility] for tasks assessed/performed      Past Medical History  Diagnosis Date  . PONV (postoperative nausea and vomiting)     once post surgery  . Hypertension   . High cholesterol   . Arthritis   . Joint pain   . GERD (gastroesophageal reflux disease)   . H/O hiatal hernia   . Thyroid disorder     benign goiter  . Glaucoma   . PVC (premature ventricular contraction)     hx of - 2002 potassium was low  . Hx: UTI (urinary tract infection)     2011 - frequent uti's (after stopping water aerobics the uti's stopped)  . Headache     hx of migraines (none in years)  . Fatty liver   . Diverticulosis     Past Surgical History  Procedure Laterality Date  . Cholecystectomy  1974  . Tubal ligation  1978  . Breast surgery  2002    left benign lumpectomy  . Knee arthroscopy  2001    right  . Cardiac catheterization  2002  . Total knee arthroplasty Right 01/23/2013    Procedure: TOTAL KNEE ARTHROPLASTY;  Surgeon: Vickey Huger, MD;  Location: [place];  Service: Orthopedics;  Laterality: Right;  . Colonoscopy    . Appendectomy    . Total knee arthroplasty Left 09/23/2015    Procedure: LEFT TOTAL KNEE ARTHROPLASTY;  Surgeon: Vickey Huger, MD;  Location: [place];  Service: Orthopedics;  Laterality: Left;    There were no vitals filed for this visit.  Visit Diagnosis:  Left knee  pain  Stiffness of left knee  Weakness of left leg  Difficulty walking  Abnormality of gait      Subjective Assessment - 10/09/15 1320    Subjective Patient s/p L TKR on 09/23/15 with [facility] PT upon discharge until 10/04/15. Patient reporting biggest limitation has been with lifting left leg onto bed. Saw MD for follow up visit yesterday and was told she had an injury to her quad during surgery and that her recovery may be slower due to this. Weakness also causes her to continue to rely on the walker for ambulation secondary to sensation of knee buckling and/or hyperextending. Continues to use CPM 6-8 hrs/day but insurance only covers CPM for 21 days, so it will be gone soon. Patient reports she required a manipulation under anesthesia after the right TKR due to lack of ROM.   Pertinent History Left TKR 09/23/15; Right TKR 01/22/13   Limitations Standing;Walking   Patient Stated Goals "To get the ROM back so I can avoid a manipulation on this knee, and the strength so I feel like I can control the knee when I walk."   Currently in Pain? Yes   Pain Score --  Current - 2-3/10; Least 1/10, Avg 2-3/10, Worst 7/10  Pain Location Knee   Pain Orientation Left   Pain Descriptors / Indicators Aching   Pain Type Surgical pain   Pain Onset 1 to 4 weeks ago   Pain Frequency Constant   Aggravating Factors  Increased activity, getting in/out of car, walking, stiff upon rising in morning   Pain Relieving Factors CPM, Ice, Pain meds   Effect of Pain on Daily Activities Limited with walking            [facility] PT Assessment - 10/09/15 1315    Assessment   Medical Diagnosis Left TKR   Referring Provider [name], MD   Next MD Visit 11/05/15  1 month from yesterday   Prior Therapy [facility] PT until 10/04/15   Balance Screen   Has the patient fallen in the past 6 months No   Has the patient had a decrease in activity level because of a fear of falling?  No   Is the patient reluctant to leave their home  because of a fear of falling?  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, activities with friends, dance, gym 3-4x/wk, recumbent bike 5 mi/day during the week   Observation/Other Assessments   Focus on Therapeutic Outcomes (FOTO)  Knee - 28% (72% limitation); Predicted 46% (54% limitation)   ROM / Strength   AROM / PROM / Strength AROM;PROM;Strength   AROM   AROM Assessment Site Knee   Right/Left Knee Right;Left   Right Knee Extension 0   Right Knee Flexion 95   Left Knee Extension 45   Left Knee Flexion 82   PROM   PROM Assessment Site Knee   Right/Left Knee Left   Left Knee Extension 0   Left Knee Flexion --  unwilling to let PT attempt PROM beyond AROM flexion   Strength   Strength Assessment Site Hip;Knee   Right/Left Hip Left   Left Hip Flexion 2/5   Left Hip ABduction 2+/5   Left Hip ADduction 2+/5   Right/Left Knee Left   Left Knee Flexion 3/5   Left Knee Extension 2/5   Palpation   Patella mobility WFL med/lat, limited sup/inf   Palpation comment ttp over anterior knee with mild edema   Ambulation/Gait   Ambulation Distance (Feet) 90 Feet   Assistive device Rolling walker   Gait Pattern Decreased stance time - left;Decreased step length - right;Antalgic  Decreased left quad control   Ambulation Surface Level;Indoor   Gait Comments Patient demonstrates decreased quad control resulting in occasional hyperextension thrust and shorter step length on right due to reluctance to maintain SLS on left.         Today's Treatment  TherEx Supine/longsitting L AAROM heelslides with strap 10x5" Longsitting L quad set/TKE with folded pillow under knee 10x5" L SAQ attempted but minimal concentric ROM achieved and no eccentric control Seated L heel slides with  foot on towel x10            PT Education - 10/09/15 1430    Education provided Yes   Education Details PT eval findings, POC, cues to normalize gait pattern and initial HEP   Person(s) Educated Patient   Methods Explanation;Demonstration;Handout   Comprehension Verbalized understanding;Returned demonstration;Need further instruction  PT Short Term Goals - 11-04-15 1442    PT SHORT TERM GOAL #1   Title Patient will be independent with initial HEP by 11/06/15   Status New           PT Long Term Goals - 2015/11/04 1443    PT LONG TERM GOAL #1   Title Pt will be independent with final HEP by 12/04/15   Status New   PT LONG TERM GOAL #2   Title Pt will demonstrate left knee AROM 5-110 or greater by 12/04/15   Status New   PT LONG TERM GOAL #3   Title Pt will demonstrate left knee and hip strength 4-/5 or greater by 12/04/15   Status New   PT LONG TERM GOAL #4   Title Pt will ambulate with normal gait pattern without AD by 12/04/15   Status New   PT LONG TERM GOAL #5   Title Pt will report no limitation in daily mobility or household tasks due to left knee pain or weakness by 12/04/15   Status New               Plan - 11/04/15 1431    Clinical Impression Statement Patient is a 65 y/o female who presents to OP PT 16 days s/p left TKR. Assessment revealed left knee PROM of 0-82, but active quad control lacking 45 degrees extension which patient attributes to an unspecified "quad injury" sustained during surgery as reported to her by the MD. In addition to severe quad weakness at 2/5, patient also demonstrating significant hip weakness, especially in hip flexion. Weakness and pain contribute to antalgic gait pattern with rolling walker with occasional extension thrust due to lac of quad control and decreased right stride length due to decreased left single limb support, preventing patient from weaning off rolling walker to [facility] or less restrictive AD.   Pt will  benefit from skilled therapeutic intervention in order to improve on the following deficits Pain;Decreased range of motion;Decreased strength;Impaired flexibility;Difficulty walking;Abnormal gait;Decreased activity tolerance;Decreased balance;Impaired perceived functional ability   Rehab Potential Good   Clinical Impairments Affecting Rehab Potential Unspecified quad injury   PT Frequency 3x / week  x 3 wks, then 2x/wk x 5 wks   PT Duration 8 weeks   PT Treatment/Interventions Therapeutic exercise;Manual techniques;Passive range of motion;Scar mobilization;Therapeutic activities;Gait training;Stair training;Electrical Stimulation;Cryotherapy;Vasopneumatic Device   PT Next Visit Plan Review HEP, TKR protocol with emphasis on quad control   Consulted and Agree with Plan of Care Patient          G-Codes - 11/04/2015 1447    Functional Assessment Tool Used Knee FOTO - 28% (72% limitation)   Functional Limitation Mobility: Walking and moving around   Mobility: Walking and Moving Around Current Status [id number]) At least 60 percent but less than 80 percent impaired, limited or restricted   Mobility: Walking and Moving Around Goal Status [phone]) At least 40 percent but less than 60 percent impaired, limited or restricted  Predicted 46% (54% limitation)       Problem List Patient Active Problem List   Diagnosis Date Noted  . 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

## 2015-10-14 ENCOUNTER — Ambulatory Visit: Payer: Medicare HMO | Admitting: Physical Therapy

## 2015-10-14 DIAGNOSIS — M25562 Pain in left knee: Secondary | ICD-10-CM | POA: Diagnosis not present

## 2015-10-14 DIAGNOSIS — R29898 Other symptoms and signs involving the musculoskeletal system: Secondary | ICD-10-CM

## 2015-10-14 DIAGNOSIS — M25662 Stiffness of left knee, not elsewhere classified: Secondary | ICD-10-CM

## 2015-10-14 DIAGNOSIS — R262 Difficulty in walking, not elsewhere classified: Secondary | ICD-10-CM

## 2015-10-14 DIAGNOSIS — R269 Unspecified abnormalities of gait and mobility: Secondary | ICD-10-CM

## 2015-10-14 NOTE — Therapy (Signed)
[facility] [address]  [place], [place], [address] Phone: [phone]   Fax:  [phone]  Physical Therapy Treatment  Patient Details  Name: Elizabeth Singh MRN: [id number] Date of Birth: [birth year] Referring Provider: [name], MD  Encounter Date: 10/14/2015      PT End of Session - 10/14/15 1427    Visit Number 2   Number of Visits 19   Date for PT Re-Evaluation 12/04/15   PT Start Time [id number]   PT Stop Time 1434   PT Time Calculation (min) 57 min   Activity Tolerance Patient tolerated treatment well   Behavior During Therapy [facility] for tasks assessed/performed      Past Medical History  Diagnosis Date  . PONV (postoperative nausea and vomiting)     once post surgery  . Hypertension   . High cholesterol   . Arthritis   . Joint pain   . GERD (gastroesophageal reflux disease)   . H/O hiatal hernia   . Thyroid disorder     benign goiter  . Glaucoma   . PVC (premature ventricular contraction)     hx of - 2002 potassium was low  . Hx: UTI (urinary tract infection)     2011 - frequent uti's (after stopping water aerobics the uti's stopped)  . Headache     hx of migraines (none in years)  . Fatty liver   . Diverticulosis     Past Surgical History  Procedure Laterality Date  . Cholecystectomy  1974  . Tubal ligation  1978  . Breast surgery  2002    left benign lumpectomy  . Knee arthroscopy  2001    right  . Cardiac catheterization  2002  . Total knee arthroplasty Right 01/23/2013    Procedure: TOTAL KNEE ARTHROPLASTY;  Surgeon: Vickey Huger, MD;  Location: [facility];  Service: Orthopedics;  Laterality: Right;  . Colonoscopy    . Appendectomy    . Total knee arthroplasty Left 09/23/2015    Procedure: LEFT TOTAL KNEE ARTHROPLASTY;  Surgeon: Vickey Huger, MD;  Location: [facility];  Service: Orthopedics;  Laterality: Left;    There were no vitals filed for this visit.  Visit Diagnosis:  Left knee  pain  Stiffness of left knee  Weakness of left leg  Difficulty walking  Abnormality of gait      Subjective Assessment - 10/14/15 1341    Subjective Pt reports "I don't feel good today, everything just feels like such an effort". Pt took pain medicine at 7 and right before she left home   Currently in Pain? Yes   Pain Score 2    Pain Location Knee   Pain Orientation Left   Pain Descriptors / Indicators Aching   Pain Type Surgical pain   Pain Onset 1 to 4 weeks ago                         [facility] Adult PT Treatment/Exercise - 10/14/15 1344    Exercises   Exercises Knee/Hip   Knee/Hip Exercises: Aerobic   Nustep L2 x 5 min   Knee/Hip Exercises: Seated   Knee/Hip Flexion PROM L knee with tibia distraction   Knee/Hip Exercises: Supine   Quad Sets Both;2 sets;10 reps   Quad Sets Limitations both to aid in L quad contraction   Short Arc [employer] Left;1 set;15 reps   Short Arc Quad Sets Limitations 5 reps with minimal elevation of  foot, 5 eccentric reps (PT assist knee ext), and 5 reps hold (PT assist knee ext)   Heel Slides Left;1 set;5 reps   Heel Slides Limitations with belt supine and longsitting, HEP review   Modalities   Modalities Vasopneumatic   Vasopneumatic   Number Minutes Vasopneumatic  15 minutes   Vasopnuematic Location  Knee   Vasopneumatic Pressure Medium   Vasopneumatic Temperature  max cold   Manual Therapy   Manual Therapy --                PT Education - 10/14/15 1427    Education provided No          PT Short Term Goals - 10/09/15 1442    PT SHORT TERM GOAL #1   Title Patient will be independent with initial HEP by 11/06/15   Status New           PT Long Term Goals - 10/09/15 1443    PT LONG TERM GOAL #1   Title Pt will be independent with final HEP by 12/04/15   Status New   PT LONG TERM GOAL #2   Title Pt will demonstrate left knee AROM 5-110 or greater by 12/04/15   Status New   PT LONG TERM GOAL #3    Title Pt will demonstrate left knee and hip strength 4-/5 or greater by 12/04/15   Status New   PT LONG TERM GOAL #4   Title Pt will ambulate with normal gait pattern without AD by 12/04/15   Status New   PT LONG TERM GOAL #5   Title Pt will report no limitation in daily mobility or household tasks due to left knee pain or weakness by 12/04/15   Status New               Plan - 10/14/15 1428    Clinical Impression Statement Pt was able to achieve 3-81 L knee AROM. Pt is afraid to move L LE without UE support for fear of it "dropping". Pt has poor concentric and eccentric quadricep control and may benefit from e-stim to aid in full quad contraction. Pt also tolerated PROM well today.    PT Next Visit Plan aggressive ROM; quad control exercises; hip/knee strengthening   Consulted and Agree with Plan of Care Patient        Problem List Patient Active Problem List   Diagnosis Date Noted  . 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  [place], [place], [address] Phone: [phone]   Fax:  [phone]  Name: Elizabeth Singh MRN: [id number] Date of Birth: 08-Nov-1950

## 2015-10-15 ENCOUNTER — Ambulatory Visit: Payer: Medicare HMO | Admitting: Physical Therapy

## 2015-10-15 DIAGNOSIS — M25662 Stiffness of left knee, not elsewhere classified: Secondary | ICD-10-CM

## 2015-10-15 DIAGNOSIS — M25562 Pain in left knee: Secondary | ICD-10-CM

## 2015-10-15 DIAGNOSIS — R262 Difficulty in walking, not elsewhere classified: Secondary | ICD-10-CM

## 2015-10-15 DIAGNOSIS — R29898 Other symptoms and signs involving the musculoskeletal system: Secondary | ICD-10-CM

## 2015-10-15 DIAGNOSIS — R269 Unspecified abnormalities of gait and mobility: Secondary | ICD-10-CM

## 2015-10-15 NOTE — Therapy (Signed)
[facility] [address]  [place], [place], [address] Phone: [phone]   Fax:  [phone]  Physical Therapy Treatment  Patient Details  Name: Elizabeth Singh MRN: [id number] Date of Birth: [birth year] Referring Provider: [name], MD  Encounter Date: 10/15/2015      PT End of Session - 10/15/15 1413    Visit Number 3   Number of Visits 19   Date for PT Re-Evaluation 12/04/15   PT Start Time [id number]   PT Stop Time 1458   PT Time Calculation (min) 56 min   Activity Tolerance Patient tolerated treatment well;Patient limited by pain   Behavior During Therapy [facility] for tasks assessed/performed;Anxious      Past Medical History  Diagnosis Date  . PONV (postoperative nausea and vomiting)     once post surgery  . Hypertension   . High cholesterol   . Arthritis   . Joint pain   . GERD (gastroesophageal reflux disease)   . H/O hiatal hernia   . Thyroid disorder     benign goiter  . Glaucoma   . PVC (premature ventricular contraction)     hx of - 2002 potassium was low  . Hx: UTI (urinary tract infection)     2011 - frequent uti's (after stopping water aerobics the uti's stopped)  . Headache     hx of migraines (none in years)  . Fatty liver   . Diverticulosis     Past Surgical History  Procedure Laterality Date  . Cholecystectomy  1974  . Tubal ligation  1978  . Breast surgery  2002    left benign lumpectomy  . Knee arthroscopy  2001    right  . Cardiac catheterization  2002  . Total knee arthroplasty Right 01/23/2013    Procedure: TOTAL KNEE ARTHROPLASTY;  Surgeon: Vickey Huger, MD;  Location: [facility];  Service: Orthopedics;  Laterality: Right;  . Colonoscopy    . Appendectomy    . Total knee arthroplasty Left 09/23/2015    Procedure: LEFT TOTAL KNEE ARTHROPLASTY;  Surgeon: Vickey Huger, MD;  Location: [facility];  Service: Orthopedics;  Laterality: Left;    There were no vitals filed for this visit.  Visit  Diagnosis:  Left knee pain  Stiffness of left knee  Weakness of left leg  Difficulty walking  Abnormality of gait      Subjective Assessment - 10/15/15 1408    Subjective Patient arrives to therapy with cane in place of RW but attempted to walk back from waiting room without AD with significant limp. Feels like she is able to lift the left leg better but everything still feels like "such an effort". Still taking pain meds just prior to coming to therapy.   Currently in Pain? Yes   Pain Score --  2-3/10, primarily with activity   Pain Location Knee   Pain Orientation Left   Pain Descriptors / Indicators Aching          Today's Treatment  TherEx NuStep - lvl 3 x 5' AAROM Peanut ball tuck for L knee flexion 10x3" Alternating quad set/hip extension isometric into peanut ball 10x3" Supine L quad set/TKE with small ball behind knee 10x5" Supine L heel slides with foot on towel and strap assist for increased flexion ROM x10   Manual Inferior/superior patellar mobs to reduce pain  Gait Adjusted SPC for proper height and provided instruction in proper sequencing of SPC while aiming for symmetrical step length  Vasopnuematic compression to L knee with lower leg elevated on bolster - medium compression, lowest temp x12' (set for 15' but stopped early due to discomfort)          PT Short Term Goals - 10/15/15 1534    PT SHORT TERM GOAL #1   Title Patient will be independent with initial HEP by 11/06/15   Status On-going           PT Long Term Goals - 10/15/15 1534    PT LONG TERM GOAL #1   Title Pt will be independent with final HEP by 12/04/15   Status On-going   PT LONG TERM GOAL #2   Title Pt will demonstrate left knee AROM 5-110 or greater by 12/04/15   Status On-going   PT LONG TERM GOAL #3   Title Pt will demonstrate left knee and hip strength 4-/5 or greater by 12/04/15   Status On-going   PT LONG TERM GOAL #4   Title Pt will ambulate with normal gait  pattern without AD by 12/04/15   Status On-going   PT LONG TERM GOAL #5   Title Pt will report no limitation in daily mobility or household tasks due to left knee pain or weakness by 12/04/15   Status On-going               Plan - 10/15/15 1517    Clinical Impression Statement Patient demonstrating improving quad activation with ability to maintain leg straighter without assitance while lifting leg on to mat but still requires assistance to fully lift leg onto mat table. Patient attempting to self-wean from RW to no AD but significant gait deviation noted with patient locking knee into extension and demonstrating decreased step length bilaterally with decreased weight shift to left LE. When cane used patient able to demonstrate good reciprocal pattern with increased step length and improved hip and knee flexion. Patient encouraged to use SPC at all times when ambulatory.   PT Next Visit Plan aggressive ROM; quad control exercises; hip/knee strengthening   Consulted and Agree with Plan of Care Patient        Problem List Patient Active Problem List   Diagnosis Date Noted  . 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

## 2015-10-15 NOTE — Discharge Summary (Signed)
SPORTS MEDICINE & JOINT REPLACEMENT   [name], MD   Carlynn Spry, PA-C [place], [place], [place]  [address]                             [phone]  PATIENT ID: Elizabeth Singh        MRN:  [id number]          DOB/AGE: [birth year] / 65 y.o.    DISCHARGE SUMMARY  ADMISSION DATE:    09/23/2015 DISCHARGE DATE:  09/26/2015  ADMISSION DIAGNOSIS: primary osteoarthritis left knee    DISCHARGE DIAGNOSIS:  primary osteoarthritis left knee    ADDITIONAL DIAGNOSIS: Active Problems:   S/P total knee arthroplasty  Past Medical History  Diagnosis Date  . PONV (postoperative nausea and vomiting)     once post surgery  . Hypertension   . High cholesterol   . Arthritis   . Joint pain   . GERD (gastroesophageal reflux disease)   . H/O hiatal hernia   . Thyroid disorder     benign goiter  . Glaucoma   . PVC (premature ventricular contraction)     hx of - 2002 potassium was low  . Hx: UTI (urinary tract infection)     2011 - frequent uti's (after stopping water aerobics the uti's stopped)  . Headache     hx of migraines (none in years)  . Fatty liver   . Diverticulosis     PROCEDURE: Procedure(s): LEFT TOTAL KNEE ARTHROPLASTY on 09/23/2015  CONSULTS:     HISTORY:  See H&P in chart  HOSPITAL COURSE:  Elizabeth Singh is a 65 y.o. admitted on 09/23/2015 and found to have a diagnosis of primary osteoarthritis left knee.  After appropriate laboratory studies were obtained  they were taken to the operating room on 09/23/2015 and underwent Procedure(s): LEFT TOTAL KNEE ARTHROPLASTY.   They were given perioperative antibiotics:  Anti-infectives    Start     Dose/Rate Route Frequency Ordered Stop   09/23/15 1600  ceFAZolin (ANCEF) IVPB 2 g/50 mL premix     2 g 100 mL/hr over 30 Minutes Intravenous Every 6 hours 09/23/15 1123 09/24/15 0429   09/23/15 0745  ceFAZolin (ANCEF) IVPB 2 g/50 mL premix     2 g 100 mL/hr over 30 Minutes Intravenous On call to O.R. 09/20/15 1239  09/23/15 0946    .  Tolerated the procedure well.  Placed with a foley intraoperatively.  Given Ofirmev at induction and for 48 hours.    POD# 1: Vital signs were stable.  Patient denied Chest pain, shortness of breath, or calf pain.  Patient was started on Lovenox 30 mg subcutaneously twice daily at 8am.  Consults to PT, OT, and care management were made.  The patient was weight bearing as tolerated.  CPM was placed on the operative leg 0-90 degrees for 6-8 hours a day.  Incentive spirometry was taught.  Dressing was changed.  Hemovac was discontinued.      POD #2, Continued  PT for ambulation and exercise program.  IV saline locked.  O2 discontinued.    The remainder of the hospital course was dedicated to ambulation and strengthening.   The patient was discharged on 2 days post op in  Good condition.  Blood products given:none  DIAGNOSTIC STUDIES: Recent vital signs: No data found.      Recent laboratory studies: No results for input(s): WBC, HGB, HCT, PLT in the last 168  hours. No results for input(s): NA, K, CL, CO2, BUN, CREATININE, GLUCOSE, CALCIUM in the last 168 hours. Lab Results  Component Value Date   INR 1.15 09/13/2015   INR 1.05 01/10/2013     Recent Radiographic Studies :  No results found.  DISCHARGE INSTRUCTIONS: Discharge Instructions    CPM    Complete by:  As directed   Continuous passive motion machine (CPM):      Use the CPM from 0 to 90 for 6-8 hours per day.      You may increase by 10 per day.  You may break it up into 2 or 3 sessions per day.      Use CPM for 2 weeks or until you are told to stop.     Call MD / Call 911    Complete by:  As directed   If you experience chest pain or shortness of breath, CALL 911 and be transported to the hospital emergency room.  If you develope a fever above 101 F, pus (white drainage) or increased drainage or redness at the wound, or calf pain, call your surgeon's office.     Change dressing    Complete by:  As  directed   Change dressing on Thursday, then change the dressing daily with sterile 4 x 4 inch gauze dressing and apply TED hose.     Constipation Prevention    Complete by:  As directed   Drink plenty of fluids.  Prune juice may be helpful.  You may use a stool softener, such as Colace (over the counter) 100 mg twice a day.  Use MiraLax (over the counter) for constipation as needed.     Diet - low sodium heart healthy    Complete by:  As directed      Do not put a pillow under the knee. Place it under the heel.    Complete by:  As directed      Driving restrictions    Complete by:  As directed   No driving for 6 weeks     Increase activity slowly as tolerated    Complete by:  As directed      Lifting restrictions    Complete by:  As directed   No lifting for 6 weeks     TED hose    Complete by:  As directed   Use stockings (TED hose) for 2 weeks on both leg(s).  You may remove them at night for sleeping.           DISCHARGE MEDICATIONS:     Medication List    STOP taking these medications        aspirin 500 MG tablet      TAKE these medications        amiloride-hydrochlorothiazide 5-50 MG tablet  Commonly known as:  MODURETIC  Take 0.5 tablets by mouth daily.     enoxaparin 40 MG/0.4ML injection  Commonly known as:  LOVENOX  Inject 0.4 mLs (40 mg total) into the skin daily.     latanoprost 0.005 % ophthalmic solution  Commonly known as:  XALATAN  Place 1 drop into both eyes at bedtime.     methocarbamol 500 MG tablet  Commonly known as:  ROBAXIN  Take 1-2 tablets (500-1,000 mg total) by mouth every 6 (six) hours as needed for muscle spasms.     metoprolol 50 MG tablet  Commonly known as:  LOPRESSOR  Take 50 mg by mouth 2 (two) times daily.  oxyCODONE 5 MG immediate release tablet  Commonly known as:  Oxy IR/ROXICODONE  Take 1-3 tablets (5-15 mg total) by mouth every 3 (three) hours as needed for breakthrough pain.     oxyCODONE 20 mg 12 hr tablet   Commonly known as:  OXYCONTIN  Take 1 tablet (20 mg total) by mouth every 12 (twelve) hours.     potassium chloride SA 20 MEQ tablet  Commonly known as:  K-DUR,KLOR-CON  Take 20 mEq by mouth every morning.        FOLLOW UP VISIT:       Follow-up Information    Follow up with [facility].   Why:  They will contact you to schedule home therapy visits.    Contact information:   [address] [place] [address] [phone]       Follow up with Rudean Haskell, MD. Call on 10/08/2015.   Specialty:  Orthopedic Surgery   Contact information:   [place] [address] [phone]       DISPOSITION: HOME   CONDITION:  Good   Felicie Kocher 10/15/2015, 11:31 AM

## 2015-10-21 ENCOUNTER — Ambulatory Visit: Payer: Medicare HMO | Admitting: Physical Therapy

## 2015-10-21 DIAGNOSIS — M25662 Stiffness of left knee, not elsewhere classified: Secondary | ICD-10-CM

## 2015-10-21 DIAGNOSIS — M25562 Pain in left knee: Secondary | ICD-10-CM | POA: Diagnosis not present

## 2015-10-21 DIAGNOSIS — R262 Difficulty in walking, not elsewhere classified: Secondary | ICD-10-CM

## 2015-10-21 DIAGNOSIS — R29898 Other symptoms and signs involving the musculoskeletal system: Secondary | ICD-10-CM

## 2015-10-21 DIAGNOSIS — R269 Unspecified abnormalities of gait and mobility: Secondary | ICD-10-CM

## 2015-10-21 NOTE — Therapy (Signed)
[facility] [address]  [place], [place], [address] Phone: [phone]   Fax:  [phone]  Physical Therapy Treatment  Patient Details  Name: Elizabeth Singh MRN: [id number] Date of Birth: 1951/07/26 Referring Provider: [name], MD  Encounter Date: 10/21/2015      PT End of Session - 10/21/15 1411    Visit Number 4   Number of Visits 19   Date for PT Re-Evaluation 12/04/15   PT Start Time [id number]   PT Stop Time 1501   PT Time Calculation (min) 56 min   Activity Tolerance Patient tolerated treatment well   Behavior During Therapy [facility] for tasks assessed/performed      Past Medical History  Diagnosis Date  . PONV (postoperative nausea and vomiting)     once post surgery  . Hypertension   . High cholesterol   . Arthritis   . Joint pain   . GERD (gastroesophageal reflux disease)   . H/O hiatal hernia   . Thyroid disorder     benign goiter  . Glaucoma   . PVC (premature ventricular contraction)     hx of - 2002 potassium was low  . Hx: UTI (urinary tract infection)     2011 - frequent uti's (after stopping water aerobics the uti's stopped)  . Headache     hx of migraines (none in years)  . Fatty liver   . Diverticulosis     Past Surgical History  Procedure Laterality Date  . Cholecystectomy  1974  . Tubal ligation  1978  . Breast surgery  2002    left benign lumpectomy  . Knee arthroscopy  2001    right  . Cardiac catheterization  2002  . Total knee arthroplasty Right 01/23/2013    Procedure: TOTAL KNEE ARTHROPLASTY;  Surgeon: Vickey Huger, MD;  Location: [facility];  Service: Orthopedics;  Laterality: Right;  . Colonoscopy    . Appendectomy    . Total knee arthroplasty Left 09/23/2015    Procedure: LEFT TOTAL KNEE ARTHROPLASTY;  Surgeon: Vickey Huger, MD;  Location: [facility];  Service: Orthopedics;  Laterality: Left;    There were no vitals filed for this visit.  Visit Diagnosis:  Left knee  pain  Stiffness of left knee  Weakness of left leg  Difficulty walking  Abnormality of gait      Subjective Assessment - 10/21/15 1409    Subjective Patient reports pain mostly just with activity at this point and has been able to discontinue taking pain meds prior to bed with no sleep disturbance. Still taking meds in morning and prior to coming to therapy.   Currently in Pain? Yes   Pain Score 0-No pain  No pain at rest, up to 3/10 with activity   Pain Location Knee   Pain Orientation Left            [facility] PT Assessment - 10/21/15 1405    ROM / Strength   AROM / PROM / Strength AROM;PROM   AROM   AROM Assessment Site Knee   Right/Left Knee Left   Left Knee Extension 33   Left Knee Flexion 96   PROM   PROM Assessment Site Knee   Right/Left Knee Left   Left Knee Extension 0   Left Knee Flexion 98           Today's Treatment  TherEx NuStep - lvl 4 x 5' AAROM Peanut ball tuck for L knee flexion 2x10x3" Alternating  quad set/hip extension isometric into peanut ball 10x3" Supine L quad set/TKE with black roll behind knee 10x5" L AAROM Quad set + SLR x10 Supine L heel slides with foot on towel and strap assist for increased flexion ROM x10 L SAQ initiated by patient, then AAROM to achieve full extension and initial eccentric lowering x10 Seated L heel slides with foot on towel x15 Leg press style knee extension with green TB x10    Vasopnuematic compression to L knee with lower leg elevated on bolster - medium compression, lowest temp x12' (set for 15' but stopped early due to discomfort)          PT Short Term Goals - 10/21/15 1601    PT SHORT TERM GOAL #1   Title Patient will be independent with initial HEP by 11/06/15   Status Achieved           PT Long Term Goals - 10/21/15 1601    PT LONG TERM GOAL #1   Title Pt will be independent with final HEP by 12/04/15   Status On-going   PT LONG TERM GOAL #2   Title Pt will demonstrate left knee  AROM 5-110 or greater by 12/04/15   Status On-going   PT LONG TERM GOAL #3   Title Pt will demonstrate left knee and hip strength 4-/5 or greater by 12/04/15   Status On-going   PT LONG TERM GOAL #4   Title Pt will ambulate with normal gait pattern without AD by 12/04/15   Status On-going   PT LONG TERM GOAL #5   Title Pt will report no limitation in daily mobility or household tasks due to left knee pain or weakness by 12/04/15   Status On-going               Plan - 10/21/15 1551    Clinical Impression Statement AROM left knee demonstrating good initial progress but still significantly lacking in extension due to quad injury (AROM 33-96). Patient continues to demonstrate limited ability to raise left leg up onto bed and unable to initiate SLR without AAROM. Tolerating progression of exercises during therapy and patient remains motivated to improve, seeking additional activities to attempt at home, therefore will review HEP and update as appropriate at next visit.   PT Next Visit Plan aggressive ROM; quad control exercises; hip/knee strengthening; review and update HEP as appropriate   Consulted and Agree with Plan of Care Patient        Problem List Patient Active Problem List   Diagnosis Date Noted  . 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  [place], [place], [address] Phone: [phone]   Fax:  [phone]  Name: Elizabeth Singh MRN: [id number] Date of Birth: [birth year]

## 2015-10-22 ENCOUNTER — Ambulatory Visit: Payer: Medicare HMO

## 2015-10-22 DIAGNOSIS — R29898 Other symptoms and signs involving the musculoskeletal system: Secondary | ICD-10-CM

## 2015-10-22 DIAGNOSIS — M25662 Stiffness of left knee, not elsewhere classified: Secondary | ICD-10-CM

## 2015-10-22 DIAGNOSIS — M25562 Pain in left knee: Secondary | ICD-10-CM | POA: Diagnosis not present

## 2015-10-22 DIAGNOSIS — R269 Unspecified abnormalities of gait and mobility: Secondary | ICD-10-CM

## 2015-10-22 DIAGNOSIS — R262 Difficulty in walking, not elsewhere classified: Secondary | ICD-10-CM

## 2015-10-22 NOTE — Therapy (Signed)
[facility] [address]  [place], [place], [address] Phone: [phone]   Fax:  [phone]  Physical Therapy Treatment  Patient Details  Name: Elizabeth Singh MRN: [id number] Date of Birth: 1951/03/11 Referring Provider: [name], MD  Encounter Date: 10/22/2015      PT End of Session - 10/22/15 1414    Visit Number 5   Number of Visits 19   Date for PT Re-Evaluation 12/04/15   PT Start Time 1400   PT Stop Time 1500   PT Time Calculation (min) 60 min   Activity Tolerance Patient tolerated treatment well   Behavior During Therapy [facility] for tasks assessed/performed      Past Medical History  Diagnosis Date  . PONV (postoperative nausea and vomiting)     once post surgery  . Hypertension   . High cholesterol   . Arthritis   . Joint pain   . GERD (gastroesophageal reflux disease)   . H/O hiatal hernia   . Thyroid disorder     benign goiter  . Glaucoma   . PVC (premature ventricular contraction)     hx of - 2002 potassium was low  . Hx: UTI (urinary tract infection)     2011 - frequent uti's (after stopping water aerobics the uti's stopped)  . Headache     hx of migraines (none in years)  . Fatty liver   . Diverticulosis     Past Surgical History  Procedure Laterality Date  . Cholecystectomy  1974  . Tubal ligation  1978  . Breast surgery  2002    left benign lumpectomy  . Knee arthroscopy  2001    right  . Cardiac catheterization  2002  . Total knee arthroplasty Right 01/23/2013    Procedure: TOTAL KNEE ARTHROPLASTY;  Surgeon: Vickey Huger, MD;  Location: [facility];  Service: Orthopedics;  Laterality: Right;  . Colonoscopy    . Appendectomy    . Total knee arthroplasty Left 09/23/2015    Procedure: LEFT TOTAL KNEE ARTHROPLASTY;  Surgeon: Vickey Huger, MD;  Location: [facility];  Service: Orthopedics;  Laterality: Left;    There were no vitals filed for this visit.  Visit Diagnosis:  Left knee  pain  Stiffness of left knee  Weakness of left leg  Difficulty walking  Abnormality of gait      Subjective Assessment - 10/22/15 1515    Subjective Pt. reports she took her pain medication 1 hour before the start of therapy.     Currently in Pain? Yes   Pain Score 1    Pain Location Knee   Pain Orientation Left   Pain Descriptors / Indicators Aching   Pain Type Surgical pain   Pain Onset 1 to 4 weeks ago   Pain Frequency Constant   Aggravating Factors  increased activity, knee flexion.     Pain Relieving Factors Ice, pain meds   Multiple Pain Sites No            [facility] PT Assessment - 10/21/15 1405    ROM / Strength   AROM / PROM / Strength AROM;PROM   AROM   AROM Assessment Site Knee   Right/Left Knee Left   Left Knee Extension 33   Left Knee Flexion 96   PROM   PROM Assessment Site Knee   Right/Left Knee Left   Left Knee Extension 0   Left Knee Flexion 98       Today's Treatment  TherEx - NuStep - lvl 4 x 5' - AAROM Peanut ball tuck for L knee flexion 2x10 (3 sec hold into extension) - Alternating quad set/hip extension isometric into peanut ball 10x3" - Supine L quad set/TKE with black roll behind knee 10 reps x 5 sec hold  - L AAROM Quad set + SLR x10 - Supine L heel slides with foot on towel and strap assist for increased flexion ROM x 10 reps (5 sec each way); pt. with increased pain in midpoint of ROM.  - L SAQ initiated by patient, then AAROM to achieve full extension and initial eccentric lowering x10 - AROM knee flexion / ext in sitting on small blue ball x 10 reps (3 sec count each way) - Fitter leg press on L in sitting x 10 reps (2 sec hold into extension); 1 blue band on fitter. - standing terminal knee extension with black TB in doorway 2 x 10 reps; black TB at knee; verbal cues provided for pt. understanding of exercise.    Vasopnuematic compression to L knee with lower leg elevated on bolster longways - medium compression, lowest temp  x13'        PT Short Term Goals - 10/21/15 1601    PT SHORT TERM GOAL #1   Title Patient will be independent with initial HEP by 11/06/15   Status Achieved           PT Long Term Goals - 10/21/15 1601    PT LONG TERM GOAL #1   Title Pt will be independent with final HEP by 12/04/15   Status On-going   PT LONG TERM GOAL #2   Title Pt will demonstrate left knee AROM 5-110 or greater by 12/04/15   Status On-going   PT LONG TERM GOAL #3   Title Pt will demonstrate left knee and hip strength 4-/5 or greater by 12/04/15   Status On-going   PT LONG TERM GOAL #4   Title Pt will ambulate with normal gait pattern without AD by 12/04/15   Status On-going   PT LONG TERM GOAL #5   Title Pt will report no limitation in daily mobility or household tasks due to left knee pain or weakness by 12/04/15   Status On-going               Plan - 10/22/15 1518    Clinical Impression Statement Pt. progressed from open chain activities to closed chain in todays session with terminal knee extension activities in standing.  Pt tolerated strengthening and ROM well in todays treatment with only a brief rise in pain with knee flexion activities.  Pt. reported a 4/10 pain with knee flexion activities, however reported pain greatly reduced following vasoneumatic device ending therapy.  Verbal review of HEP with pt. today with focus on importance of AAROM knee flexion at home.  Pt. reported she took pain meds before treatment today.  Focus of todays treatment was strengthening of L quads to decrease extensor lag with LLE.  Pt. progressing toward all goals.   PT Next Visit Plan aggressive ROM; quad control exercises; hip/knee strengthening; review and update HEP as appropriate.  Progress to more standing hip strengthening.  Continue with POC.    Consulted and Agree with Plan of Care Patient        Problem List Patient Active Problem List   Diagnosis Date Noted  . 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  [place], [place], [address] Phone: [phone]   Fax:  [phone]  Name: Elizabeth Singh MRN: [id number] Date of Birth: March 28, 1951

## 2015-10-24 ENCOUNTER — Ambulatory Visit: Payer: Medicare HMO | Attending: Orthopedic Surgery

## 2015-10-24 DIAGNOSIS — M25562 Pain in left knee: Secondary | ICD-10-CM

## 2015-10-24 DIAGNOSIS — R269 Unspecified abnormalities of gait and mobility: Secondary | ICD-10-CM | POA: Diagnosis present

## 2015-10-24 DIAGNOSIS — R262 Difficulty in walking, not elsewhere classified: Secondary | ICD-10-CM | POA: Diagnosis present

## 2015-10-24 DIAGNOSIS — M25662 Stiffness of left knee, not elsewhere classified: Secondary | ICD-10-CM | POA: Diagnosis present

## 2015-10-24 DIAGNOSIS — R29898 Other symptoms and signs involving the musculoskeletal system: Secondary | ICD-10-CM | POA: Diagnosis present

## 2015-10-24 NOTE — Therapy (Signed)
[facility] [address]  [place], [place], [address] Phone: [phone]   Fax:  [phone]  Physical Therapy Treatment  Patient Details  Name: Elizabeth Singh MRN: [id number] Date of Birth: [birth year] Referring Provider: [name], MD  Encounter Date: 10/24/2015      PT End of Session - 10/24/15 1313    Visit Number 6   Number of Visits 19   Date for PT Re-Evaluation 12/04/15   PT Start Time [id number]   PT Stop Time 1415   PT Time Calculation (min) 61 min   Activity Tolerance Patient tolerated treatment well   Behavior During Therapy [facility] for tasks assessed/performed      Past Medical History  Diagnosis Date  . PONV (postoperative nausea and vomiting)     once post surgery  . Hypertension   . High cholesterol   . Arthritis   . Joint pain   . GERD (gastroesophageal reflux disease)   . H/O hiatal hernia   . Thyroid disorder     benign goiter  . Glaucoma   . PVC (premature ventricular contraction)     hx of - 2002 potassium was low  . Hx: UTI (urinary tract infection)     2011 - frequent uti's (after stopping water aerobics the uti's stopped)  . Headache     hx of migraines (none in years)  . Fatty liver   . Diverticulosis     Past Surgical History  Procedure Laterality Date  . Cholecystectomy  1974  . Tubal ligation  1978  . Breast surgery  2002    left benign lumpectomy  . Knee arthroscopy  2001    right  . Cardiac catheterization  2002  . Total knee arthroplasty Right 01/23/2013    Procedure: TOTAL KNEE ARTHROPLASTY;  Surgeon: Vickey Huger, MD;  Location: [facility];  Service: Orthopedics;  Laterality: Right;  . Colonoscopy    . Appendectomy    . Total knee arthroplasty Left 09/23/2015    Procedure: LEFT TOTAL KNEE ARTHROPLASTY;  Surgeon: Vickey Huger, MD;  Location: [facility];  Service: Orthopedics;  Laterality: Left;    There were no vitals filed for this visit.  Visit Diagnosis:  Left knee  pain  Stiffness of left knee  Weakness of left leg  Difficulty walking  Abnormality of gait      Subjective Assessment - 10/24/15 1321    Subjective Pt. reports she was on her feet for 4 hours last night having friends over and that set her back.     Currently in Pain? Yes   Pain Score 1    Pain Location Knee   Pain Orientation Left   Pain Descriptors / Indicators Aching   Pain Type Surgical pain   Pain Onset 1 to 4 weeks ago   Pain Frequency Constant   Aggravating Factors  increased activity, knee flexion, prolonged standing.   Effect of Pain on Daily Activities limited with walking   Multiple Pain Sites No     Today's Treatment  TherEx - NuStep - lvl 4 x 5 min  - AAROM Peanut ball tuck for L knee flexion 2x10 (3 sec hold into extension) - Alternating quad set/hip extension isometric into peanut ball 10x3" - Supine L quad set/TKE with black roll behind knee 10 reps x 5 sec hold  - L AAROM Quad set + SLR x10 - L SAQ initiated by patient, then AAROM to achieve full extension and initial eccentric lowering  x10 - Fitter leg press on L in sitting 2 x 10 reps (2 sec hold into extension); 1 blue band on fitter. - standing terminal knee extension with black TB in doorway 2 x 10 reps; black TB at knee; verbal cues provided for pt. understanding of exercise.   Vasopnuematic compression to L knee with lower leg elevated on bolster longways - medium compression, lowest temp x15'        PT Short Term Goals - 10/21/15 1601    PT SHORT TERM GOAL #1   Title Patient will be independent with initial HEP by 11/06/15   Status Achieved           PT Long Term Goals - 10/21/15 1601    PT LONG TERM GOAL #1   Title Pt will be independent with final HEP by 12/04/15   Status On-going   PT LONG TERM GOAL #2   Title Pt will demonstrate left knee AROM 5-110 or greater by 12/04/15   Status On-going   PT LONG TERM GOAL #3   Title Pt will demonstrate left knee and hip strength 4-/5  or greater by 12/04/15   Status On-going   PT LONG TERM GOAL #4   Title Pt will ambulate with normal gait pattern without AD by 12/04/15   Status On-going   PT LONG TERM GOAL #5   Title Pt will report no limitation in daily mobility or household tasks due to left knee pain or weakness by 12/04/15   Status On-going               Plan - 10/24/15 1407    Clinical Impression Statement Pt. reports her L knee pain greatly increased after being on her feet 4 hours yesterday night however reports L knee pain is back down to a 1/10 currently.  Pt. progressed to greater volume of terminal knee extension activities in todays session and tolerated increase well.  L calf stretch added today with seat belft assist in supine.  Pt. with increasing L quad strength, with less therapist assistance provided with straight leg raises.     PT Next Visit Plan aggressive ROM; quad control exercises; hip/knee strengthening; review and update HEP as appropriate.  Progress to more standing hip strengthening.  Progress to two blue bands on Fitter terminal knee extension.  Continue with POC.    Consulted and Agree with Plan of Care Patient        Problem List Patient Active Problem List   Diagnosis Date Noted  . 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

## 2015-10-28 ENCOUNTER — Ambulatory Visit: Payer: Medicare HMO | Admitting: Physical Therapy

## 2015-10-28 DIAGNOSIS — M25662 Stiffness of left knee, not elsewhere classified: Secondary | ICD-10-CM

## 2015-10-28 DIAGNOSIS — M25562 Pain in left knee: Secondary | ICD-10-CM

## 2015-10-28 DIAGNOSIS — R269 Unspecified abnormalities of gait and mobility: Secondary | ICD-10-CM

## 2015-10-28 DIAGNOSIS — R29898 Other symptoms and signs involving the musculoskeletal system: Secondary | ICD-10-CM

## 2015-10-28 DIAGNOSIS — R262 Difficulty in walking, not elsewhere classified: Secondary | ICD-10-CM

## 2015-10-28 NOTE — Therapy (Signed)
[facility] [address]  [place], [place], [address] Phone: [phone]   Fax:  [phone]  Physical Therapy Treatment  Patient Details  Name: Elizabeth Singh MRN: [id number] Date of Birth: November 05, 1950 Referring Provider: [name], MD  Encounter Date: 10/28/2015      PT End of Session - 10/28/15 1454    Visit Number 7   Number of Visits 19   Date for PT Re-Evaluation 12/04/15   PT Start Time [id number]   PT Stop Time 1542   PT Time Calculation (min) 56 min   Activity Tolerance Patient tolerated treatment well   Behavior During Therapy [facility] for tasks assessed/performed      Past Medical History  Diagnosis Date  . PONV (postoperative nausea and vomiting)     once post surgery  . Hypertension   . High cholesterol   . Arthritis   . Joint pain   . GERD (gastroesophageal reflux disease)   . H/O hiatal hernia   . Thyroid disorder     benign goiter  . Glaucoma   . PVC (premature ventricular contraction)     hx of - 2002 potassium was low  . Hx: UTI (urinary tract infection)     2011 - frequent uti's (after stopping water aerobics the uti's stopped)  . Headache     hx of migraines (none in years)  . Fatty liver   . Diverticulosis     Past Surgical History  Procedure Laterality Date  . Cholecystectomy  1974  . Tubal ligation  1978  . Breast surgery  2002    left benign lumpectomy  . Knee arthroscopy  2001    right  . Cardiac catheterization  2002  . Total knee arthroplasty Right 01/23/2013    Procedure: TOTAL KNEE ARTHROPLASTY;  Surgeon: Vickey Huger, MD;  Location: [facility];  Service: Orthopedics;  Laterality: Right;  . Colonoscopy    . Appendectomy    . Total knee arthroplasty Left 09/23/2015    Procedure: LEFT TOTAL KNEE ARTHROPLASTY;  Surgeon: Vickey Huger, MD;  Location: [facility];  Service: Orthopedics;  Laterality: Left;    There were no vitals filed for this visit.  Visit Diagnosis:  Left knee  pain  Stiffness of left knee  Weakness of left leg  Difficulty walking  Abnormality of gait      Subjective Assessment - 10/28/15 1452    Subjective Patient inquiring about using her bike at home to help loosen her knee and stretch.   Currently in Pain? No/denies            [facility] PT Assessment - 10/28/15 1446    ROM / Strength   AROM / PROM / Strength AROM   AROM   AROM Assessment Site Knee   Right/Left Knee Left   Left Knee Extension 28   Left Knee Flexion 100         Today's Treatment  AROM L knee  TherEx NuStep - lvl 5 x 4' Rec Bike - fwd/back rocking for increased flexion ROM x4' AAROM Peanut ball tuck for L knee flexion x15, 3" hold Alternating quad set/hip extension isometric into peanut ball 15x3" Bridge with feet on peanut ball 10x3" L SAQ x10 (slightly lacking TKE); 2nd set initiated by patient, then AAROM to achieve full extension with AROM eccentric lowering x10 Fitter leg press on L in sitting (2 blue) 2x10, 3" hold Standing TKE with black TB 2x10   Vasopnuematic compression to  L knee with lower leg elevated on bolster longways - medium compression, lowest temp x15'          PT Education - 10/28/15 1812    Education provided Yes   Education Details SAQ added to [employer]) Educated Patient   Methods Explanation;Demonstration;Handout   Comprehension Verbalized understanding;Returned demonstration          PT Short Term Goals - 10/21/15 1601    PT SHORT TERM GOAL #1   Title Patient will be independent with initial HEP by 11/06/15   Status Achieved           PT Long Term Goals - 10/28/15 1815    PT LONG TERM GOAL #1   Title Pt will be independent with final HEP by 12/04/15   Status On-going   PT LONG TERM GOAL #2   Title Pt will demonstrate left knee AROM 5-110 or greater by 12/04/15   Status On-going   PT LONG TERM GOAL #3   Title Pt will demonstrate left knee and hip strength 4-/5 or greater by 12/04/15   Status  On-going   PT LONG TERM GOAL #4   Title Pt will ambulate with normal gait pattern without AD by 12/04/15   Status On-going   PT LONG TERM GOAL #5   Title Pt will report no limitation in daily mobility or household tasks due to left knee pain or weakness by 12/04/15   Status On-going               Plan - 10/28/15 1810    Clinical Impression Statement Patient continues to demonstrate improvement in L knee flexion and extension ROM (currently 28-100) with improving quad activation during exercises but still experiences diffculty lifting left leg up onto mat/bed. SAQ added to HEP as patient now able to complete near full motion without AAROM.  Will plan to introduce standing hip exercises at next visit and add to HEP as appropraite.   PT Next Visit Plan aggressive ROM; quad control exercises; hip/knee strengthening; review and update HEP as appropriate including progression to more standing hip strengthening   Consulted and Agree with Plan of Care Patient        Problem List Patient Active Problem List   Diagnosis Date Noted  . 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

## 2015-10-29 ENCOUNTER — Ambulatory Visit: Payer: Medicare HMO

## 2015-10-29 DIAGNOSIS — R269 Unspecified abnormalities of gait and mobility: Secondary | ICD-10-CM

## 2015-10-29 DIAGNOSIS — M25562 Pain in left knee: Secondary | ICD-10-CM

## 2015-10-29 DIAGNOSIS — R29898 Other symptoms and signs involving the musculoskeletal system: Secondary | ICD-10-CM

## 2015-10-29 DIAGNOSIS — R262 Difficulty in walking, not elsewhere classified: Secondary | ICD-10-CM

## 2015-10-29 DIAGNOSIS — M25662 Stiffness of left knee, not elsewhere classified: Secondary | ICD-10-CM

## 2015-10-29 NOTE — Therapy (Signed)
[facility] [address]  [place], [place], [address] Phone: [phone]   Fax:  [phone]  Physical Therapy Treatment  Patient Details  Name: Elizabeth Singh MRN: [id number] Date of Birth: September 08, 1951 Referring Provider: [name], MD  Encounter Date: 10/29/2015      PT End of Session - 10/29/15 1406    Visit Number 8   Number of Visits 19   Date for PT Re-Evaluation 12/04/15   PT Start Time 1402   PT Stop Time 1458   PT Time Calculation (min) 56 min   Activity Tolerance Patient tolerated treatment well   Behavior During Therapy [facility] for tasks assessed/performed      Past Medical History  Diagnosis Date  . PONV (postoperative nausea and vomiting)     once post surgery  . Hypertension   . High cholesterol   . Arthritis   . Joint pain   . GERD (gastroesophageal reflux disease)   . H/O hiatal hernia   . Thyroid disorder     benign goiter  . Glaucoma   . PVC (premature ventricular contraction)     hx of - 2002 potassium was low  . Hx: UTI (urinary tract infection)     2011 - frequent uti's (after stopping water aerobics the uti's stopped)  . Headache     hx of migraines (none in years)  . Fatty liver   . Diverticulosis     Past Surgical History  Procedure Laterality Date  . Cholecystectomy  1974  . Tubal ligation  1978  . Breast surgery  2002    left benign lumpectomy  . Knee arthroscopy  2001    right  . Cardiac catheterization  2002  . Total knee arthroplasty Right 01/23/2013    Procedure: TOTAL KNEE ARTHROPLASTY;  Surgeon: Vickey Huger, MD;  Location: [place];  Service: Orthopedics;  Laterality: Right;  . Colonoscopy    . Appendectomy    . Total knee arthroplasty Left 09/23/2015    Procedure: LEFT TOTAL KNEE ARTHROPLASTY;  Surgeon: Vickey Huger, MD;  Location: [place];  Service: Orthopedics;  Laterality: Left;    There were no vitals filed for this visit.  Visit Diagnosis:  Left knee  pain  Stiffness of left knee  Weakness of left leg  Difficulty walking  Abnormality of gait      Subjective Assessment - 10/29/15 1408    Subjective Pt. states, "my L knee doesn't hurt right now it's just the most stiff in the mornings.".   Patient Stated Goals "To get the ROM back so I can avoid a manipulation on this knee, and the strength so I feel like I can control the knee when I walk."   Currently in Pain? No/denies   Pain Score 0-No pain   Multiple Pain Sites No      Today's Treatment  AROM L knee  TherEx - NuStep - lvl 5 x 4.5' - Rec Bike - fwd/back rocking for increased flexion ROM x 3'  - Standing B hip abduction / extension / flexion 2 x 10 reps hold onto counter; pt. demo'd good understanding and technique; added to HEP.   Manual: - L knee AP mobs grade 1-2 in supine ~ 60 dg of L knee flexion, heel on table. - L knee distraction in supine x 1 min (for pain relief) ~ 80 dg of L knee flexion.   TherEx - AAROM Peanut ball tuck for L knee flexion x 15,  3" hold - L SAQ x10 (slightly lacking TKE); 2nd set initiated by patient, then AAROM to achieve full extension with AROM eccentric lowering x10 - Fitter leg press on L in sitting (2 blue) 2x15, 3" hold  Vasopnuematic compression to L knee with lower leg elevated on bolster longways - medium compression, lowest temp x10'           PT Education - 10/29/15 1506    Education provided Yes   Education Details Standing B hip abduction / extension / flexion holding onto counter added to HEP.   Person(s) Educated Patient   Methods Explanation;Demonstration;Verbal cues   Comprehension Verbalized understanding;Returned demonstration;Verbal cues required          PT Short Term Goals - 10/21/15 1601    PT SHORT TERM GOAL #1   Title Patient will be independent with initial HEP by 11/06/15   Status Achieved           PT Long Term Goals - 10/28/15 1815    PT LONG TERM GOAL #1   Title Pt will be independent  with final HEP by 12/04/15   Status On-going   PT LONG TERM GOAL #2   Title Pt will demonstrate left knee AROM 5-110 or greater by 12/04/15   Status On-going   PT LONG TERM GOAL #3   Title Pt will demonstrate left knee and hip strength 4-/5 or greater by 12/04/15   Status On-going   PT LONG TERM GOAL #4   Title Pt will ambulate with normal gait pattern without AD by 12/04/15   Status On-going   PT LONG TERM GOAL #5   Title Pt will report no limitation in daily mobility or household tasks due to left knee pain or weakness by 12/04/15   Status On-going               Plan - 10/29/15 1455    Clinical Impression Statement Pt. tolerated progression to standing hip strengthening well today with only minimal L quad weakness reported.  Pt. increased repetitions with terminal knee extension activities and continues to improve L LE strength.  L knee extension lag continues to decrease.  Standing hip strengthening added to pt. HEP today.     PT Next Visit Plan aggressive ROM; quad control exercises; hip/knee strengthening; review and update HEP as appropriate including progression to more standing hip strengthening   Consulted and Agree with Plan of Care Patient        Problem List Patient Active Problem List   Diagnosis Date Noted  . 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  [place], [place], [address] Phone: [phone]   Fax:  [phone]  Name: Elizabeth Singh MRN: [id number] Date of Birth: [birth year]

## 2015-10-31 ENCOUNTER — Ambulatory Visit: Payer: Medicare HMO | Admitting: Physical Therapy

## 2015-10-31 DIAGNOSIS — M25562 Pain in left knee: Secondary | ICD-10-CM

## 2015-10-31 DIAGNOSIS — R262 Difficulty in walking, not elsewhere classified: Secondary | ICD-10-CM

## 2015-10-31 DIAGNOSIS — R29898 Other symptoms and signs involving the musculoskeletal system: Secondary | ICD-10-CM

## 2015-10-31 DIAGNOSIS — R269 Unspecified abnormalities of gait and mobility: Secondary | ICD-10-CM

## 2015-10-31 DIAGNOSIS — M25662 Stiffness of left knee, not elsewhere classified: Secondary | ICD-10-CM

## 2015-10-31 NOTE — Therapy (Signed)
[facility] [address]  [place], [place], [address] Phone: [phone]   Fax:  [phone]  Physical Therapy Treatment  Patient Details  Name: Elizabeth Singh MRN: [id number] Date of Birth: Aug 14, 1951 Referring Provider: [name], MD  Encounter Date: 10/31/2015      PT End of Session - 10/31/15 1412    Visit Number 9   Number of Visits 19   Date for PT Re-Evaluation 12/04/15   PT Start Time 1400   PT Stop Time 1504   PT Time Calculation (min) 64 min   Activity Tolerance Patient tolerated treatment well   Behavior During Therapy [facility] for tasks assessed/performed      Past Medical History  Diagnosis Date  . PONV (postoperative nausea and vomiting)     once post surgery  . Hypertension   . High cholesterol   . Arthritis   . Joint pain   . GERD (gastroesophageal reflux disease)   . H/O hiatal hernia   . Thyroid disorder     benign goiter  . Glaucoma   . PVC (premature ventricular contraction)     hx of - 2002 potassium was low  . Hx: UTI (urinary tract infection)     2011 - frequent uti's (after stopping water aerobics the uti's stopped)  . Headache     hx of migraines (none in years)  . Fatty liver   . Diverticulosis     Past Surgical History  Procedure Laterality Date  . Cholecystectomy  1974  . Tubal ligation  1978  . Breast surgery  2002    left benign lumpectomy  . Knee arthroscopy  2001    right  . Cardiac catheterization  2002  . Total knee arthroplasty Right 01/23/2013    Procedure: TOTAL KNEE ARTHROPLASTY;  Surgeon: Vickey Huger, MD;  Location: [place];  Service: Orthopedics;  Laterality: Right;  . Colonoscopy    . Appendectomy    . Total knee arthroplasty Left 09/23/2015    Procedure: LEFT TOTAL KNEE ARTHROPLASTY;  Surgeon: Vickey Huger, MD;  Location: [facility];  Service: Orthopedics;  Laterality: Left;    There were no vitals filed for this visit.  Visit Diagnosis:  Left knee  pain  Stiffness of left knee  Weakness of left leg  Difficulty walking  Abnormality of gait      Subjective Assessment - 10/31/15 1408    Subjective Patient noting some medial knee later in the day after last therapy session but no pain today. When checking her MD appt card realized her appt is not until 11/12/15, but plans to see if she can change it to next week as she wants to find out if she can start driving again.   Currently in Pain? No/denies            [facility] PT Assessment - 10/31/15 1400    Assessment   Next MD Visit 11/12/15  Pt checked appt card & realized date was different   ROM / Strength   AROM / PROM / Strength AROM;PROM   AROM   AROM Assessment Site Knee   Right/Left Knee Left   Left Knee Extension 16   Left Knee Flexion 101   PROM   PROM Assessment Site Knee   Right/Left Knee Left   Left Knee Extension 0   Left Knee Flexion 106        Today's Treatment  AROM/PROM check L knee  TherEx NuStep - lvl 5  x 4'  Rec Bike - fwd/back rocking for increased flexion ROM x 4'   Manual L knee grade 3-4 flexion mobs (pt with limited tolerance) L knee superior/inferior patellar mobs Scar tissue massage deferred due to continued scabbing present L knee distraction in supine x 1 min (for pain relief) ~ 80 dg of L knee flexion   TherEx Standing 9" Step stretch for increased knee flexion 10x5" Standing B hip abduction & extension (knee straight) and hip flexion (knee bent) 2# 2x10 reps, HHA on counter Leg Press B 10# x10, 15# x10 L SLR AAROM for concentric lift, 2 finger support from PT for eccentric lowering x8  Vasopnuematic compression to L knee with lower leg elevated on bolster longways - medium compression, lowest temp x10'          PT Short Term Goals - 10/21/15 1601    PT SHORT TERM GOAL #1   Title Patient will be independent with initial HEP by 11/06/15   Status Achieved           PT Long Term Goals - 10/31/15 1455    PT LONG TERM  GOAL #1   Title Pt will be independent with final HEP by 12/04/15   Status On-going   PT LONG TERM GOAL #2   Title Pt will demonstrate left knee AROM 5-110 or greater by 12/04/15   Status On-going   PT LONG TERM GOAL #3   Title Pt will demonstrate left knee and hip strength 4-/5 or greater by 12/04/15   Status On-going   PT LONG TERM GOAL #4   Title Pt will ambulate with normal gait pattern without AD by 12/04/15   Status On-going   PT LONG TERM GOAL #5   Title Pt will report no limitation in daily mobility or household tasks due to left knee pain or weakness by 12/04/15   Status On-going               Plan - 10/31/15 1457    Clinical Impression Statement Patient continues to demonstrate improving L quad control with patient now only lacking 16 dg of TKE as compared with 45 dg on eval. L knee flexion ROM also improving (currently AROM to 101 & PROM to 108) but at a slower pace due to patient fear of pain and resistance to stretching/mobs for flexion ROM. Patient continues to report senstation on knee instability with single limb support during standing exercies. Patient wanting to wean off SPC but educated on increased fall risk due to continued quad weakness and lack of TKE, with patient instructed to continue to use SPC until full active extension ROM achieved.   PT Next Visit Plan aggressive ROM; quad control exercises; hip/knee strengthening; review and update HEP; **10th visit FOTO/G-Code; **MD note if pt successful in rescheduling MD appt   Consulted and Agree with Plan of Care Patient        Problem List Patient Active Problem List   Diagnosis Date Noted  . 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  [place], [place], [address] Phone: [phone]   Fax:  [phone]  Name: Elizabeth Singh MRN: [id number] Date of Birth: [birth year]

## 2015-11-04 ENCOUNTER — Ambulatory Visit: Payer: Medicare HMO | Admitting: Physical Therapy

## 2015-11-04 DIAGNOSIS — R29898 Other symptoms and signs involving the musculoskeletal system: Secondary | ICD-10-CM

## 2015-11-04 DIAGNOSIS — R269 Unspecified abnormalities of gait and mobility: Secondary | ICD-10-CM

## 2015-11-04 DIAGNOSIS — M25562 Pain in left knee: Secondary | ICD-10-CM

## 2015-11-04 DIAGNOSIS — R262 Difficulty in walking, not elsewhere classified: Secondary | ICD-10-CM

## 2015-11-04 DIAGNOSIS — M25662 Stiffness of left knee, not elsewhere classified: Secondary | ICD-10-CM

## 2015-11-04 NOTE — Therapy (Signed)
[facility] [address]  [place], [place], [address] Phone: [phone]   Fax:  [phone]  Physical Therapy Treatment  Patient Details  Name: Elizabeth Singh MRN: [id number] Date of Birth: 1951-05-07 Referring Provider: [name], MD  Encounter Date: 11/04/2015      PT End of Session - 11/04/15 1410    Visit Number 10   Number of Visits 19   Date for PT Re-Evaluation 12/04/15   PT Start Time 1400   PT Stop Time 1459   PT Time Calculation (min) 59 min   Activity Tolerance Patient limited by pain   Behavior During Therapy [facility] for tasks assessed/performed      Past Medical History  Diagnosis Date  . PONV (postoperative nausea and vomiting)     once post surgery  . Hypertension   . High cholesterol   . Arthritis   . Joint pain   . GERD (gastroesophageal reflux disease)   . H/O hiatal hernia   . Thyroid disorder     benign goiter  . Glaucoma   . PVC (premature ventricular contraction)     hx of - 2002 potassium was low  . Hx: UTI (urinary tract infection)     2011 - frequent uti's (after stopping water aerobics the uti's stopped)  . Headache     hx of migraines (none in years)  . Fatty liver   . Diverticulosis     Past Surgical History  Procedure Laterality Date  . Cholecystectomy  1974  . Tubal ligation  1978  . Breast surgery  2002    left benign lumpectomy  . Knee arthroscopy  2001    right  . Cardiac catheterization  2002  . Total knee arthroplasty Right 01/23/2013    Procedure: TOTAL KNEE ARTHROPLASTY;  Surgeon: Vickey Huger, MD;  Location: [facility];  Service: Orthopedics;  Laterality: Right;  . Colonoscopy    . Appendectomy    . Total knee arthroplasty Left 09/23/2015    Procedure: LEFT TOTAL KNEE ARTHROPLASTY;  Surgeon: Vickey Huger, MD;  Location: [facility];  Service: Orthopedics;  Laterality: Left;    There were no vitals filed for this visit.  Visit Diagnosis:  Left knee pain  Stiffness  of left knee  Weakness of left leg  Difficulty walking  Abnormality of gait      Subjective Assessment - 11/04/15 1402    Subjective Patient states her knee feels more stiff and swollen today, but unable to identify any triggering event. Reports she was visiting with friends and a little more active than usual on Saturday but normal day on Sunday. Was able to change MD appt to this week and will see MD tomorrow.   Currently in Pain? Yes   Pain Score 5    Pain Location Knee   Pain Orientation Left   Pain Descriptors / Indicators --  Stiffness            [facility] PT Assessment - 11/04/15 1400    Assessment   Medical Diagnosis Left TKR   Next MD Visit 11/05/15   Observation/Other Assessments   Focus on Therapeutic Outcomes (FOTO)  Knee - 32% (68% limitation)   ROM / Strength   AROM / PROM / Strength AROM;PROM;Strength   AROM   AROM Assessment Site Knee   Right/Left Knee Left   Left Knee Extension 16   Left Knee Flexion 100   PROM   PROM Assessment Site Knee  Right/Left Knee Left   Left Knee Extension 0   Left Knee Flexion 103   Strength   Strength Assessment Site Hip;Knee   Right/Left Hip Left   Left Hip Flexion 3-/5   Left Hip Extension 3/5   Left Hip ABduction 3/5   Left Hip ADduction 3/5   Right/Left Knee Left   Left Knee Flexion 3+/5   Left Knee Extension 3-/5           Today's Treatment  AROM/PROM check L knee MMT L LE Goal Assessment  TherEx NuStep - lvl 5 x 3'  Rec Bike - fwd/back rocking for increased flexion ROM x 5'   Manual L knee grade 3-4 flexion mobs (pt with limited tolerance) L knee superior/inferior patellar mobs Scar tissue massage deferred due to continued scabbing present L knee distraction in supine x 1 min (for pain relief) ~ 80 dg of L knee flexion  TherEx AAROM Peanut ball tuck for L knee flexion x15, 3" hold Bridge with feet on peanut ball 10x3" L SLR AAROM for concentric lift, 2 finger support from PT for eccentric  lowering x10 L SAQ x10 (slightly lacking TKE); 2nd set initiated by patient, then AAROM to achieve full extension with AROM eccentric lowering x10  Vasopnuematic compression to L knee with lower leg elevated on bolster - medium compression, lowest temp x10'          PT Short Term Goals - 10/21/15 1601    PT SHORT TERM GOAL #1   Title Patient will be independent with initial HEP by 11/06/15   Status Achieved           PT Long Term Goals - 11/04/15 1444    PT LONG TERM GOAL #1   Title Pt will be independent with final HEP by 12/04/15   Status On-going   PT LONG TERM GOAL #2   Title Pt will demonstrate left knee AROM 5-110 or greater by 12/04/15   Status On-going   PT LONG TERM GOAL #3   Title Pt will demonstrate left knee and hip strength 4-/5 or greater by 12/04/15   Status On-going   PT LONG TERM GOAL #4   Title Pt will ambulate with normal gait pattern without AD by 12/04/15   Status On-going   PT LONG TERM GOAL #5   Title Pt will report no limitation in daily mobility or household tasks due to left knee pain or weakness by 12/04/15   Status On-going               Plan - 11/04/15 1447    Clinical Impression Statement Patient demonstrating good initial gains with L knee ROM with PROM 0-103 today (flexion ROM had been up to 108 dg last week but more swollen and painful today) and AROM 16-100 with quad control and TKE significantly improved from 45 dg lacking on eval. Left LE strength improving to grossly 3/5 to 3+/5 although remains weakest in hip flexion and knee extension at 3-/5 due to persistent quad weakness. Pain has been well controlled for the most part until today with patient currently reporting 5/10, whereas she was only reporting pain during activties to increase ROM last week. Patient has weaned from Lynd and currenly using [facility] for ambulation due to continued lack of TKE and patient reported sensation of L knee instability during single limb stance.   PT Next  Visit Plan aggressive ROM; quad control exercises; hip/knee strengthening; review and update HEP PRN   Consulted and  Agree with Plan of Care Patient          G-Codes - 2015-12-02 1458    Functional Assessment Tool Used Knee FOTO - 32% (68% limitation)   Functional Limitation Mobility: Walking and moving around   Mobility: Walking and Moving Around Current Status [phone]) At least 60 percent but less than 80 percent impaired, limited or restricted   Mobility: Walking and Moving Around Goal Status [phone]) At least 40 percent but less than 60 percent impaired, limited or restricted      Problem List Patient Active Problem List   Diagnosis Date Noted  . 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

## 2015-11-07 ENCOUNTER — Ambulatory Visit: Payer: Medicare HMO | Admitting: Physical Therapy

## 2015-11-07 DIAGNOSIS — M25562 Pain in left knee: Secondary | ICD-10-CM

## 2015-11-07 DIAGNOSIS — M25662 Stiffness of left knee, not elsewhere classified: Secondary | ICD-10-CM

## 2015-11-07 DIAGNOSIS — R269 Unspecified abnormalities of gait and mobility: Secondary | ICD-10-CM

## 2015-11-07 DIAGNOSIS — R29898 Other symptoms and signs involving the musculoskeletal system: Secondary | ICD-10-CM

## 2015-11-07 DIAGNOSIS — R262 Difficulty in walking, not elsewhere classified: Secondary | ICD-10-CM

## 2015-11-07 NOTE — Therapy (Signed)
[facility] [address]  [place], [place], [address] Phone: [phone]   Fax:  [phone]  Physical Therapy Treatment  Patient Details  Name: Elizabeth Singh MRN: [id number] Date of Birth: 1951/02/12 Referring Provider: [name], MD  Encounter Date: 11/07/2015      PT End of Session - 11/07/15 1408    Visit Number 11   Number of Visits 19   Date for PT Re-Evaluation 12/04/15   PT Start Time [id number]   PT Stop Time 1458   PT Time Calculation (min) 59 min   Activity Tolerance Patient tolerated treatment well   Behavior During Therapy [facility] for tasks assessed/performed      Past Medical History  Diagnosis Date  . PONV (postoperative nausea and vomiting)     once post surgery  . Hypertension   . High cholesterol   . Arthritis   . Joint pain   . GERD (gastroesophageal reflux disease)   . H/O hiatal hernia   . Thyroid disorder     benign goiter  . Glaucoma   . PVC (premature ventricular contraction)     hx of - 2002 potassium was low  . Hx: UTI (urinary tract infection)     2011 - frequent uti's (after stopping water aerobics the uti's stopped)  . Headache     hx of migraines (none in years)  . Fatty liver   . Diverticulosis     Past Surgical History  Procedure Laterality Date  . Cholecystectomy  1974  . Tubal ligation  1978  . Breast surgery  2002    left benign lumpectomy  . Knee arthroscopy  2001    right  . Cardiac catheterization  2002  . Total knee arthroplasty Right 01/23/2013    Procedure: TOTAL KNEE ARTHROPLASTY;  Surgeon: Vickey Huger, MD;  Location: [facility];  Service: Orthopedics;  Laterality: Right;  . Colonoscopy    . Appendectomy    . Total knee arthroplasty Left 09/23/2015    Procedure: LEFT TOTAL KNEE ARTHROPLASTY;  Surgeon: Vickey Huger, MD;  Location: [facility];  Service: Orthopedics;  Laterality: Left;    There were no vitals filed for this visit.  Visit Diagnosis:  Left knee  pain  Stiffness of left knee  Weakness of left leg  Difficulty walking  Abnormality of gait      Subjective Assessment - 11/07/15 1404    Subjective Pt saw MD since last visit and states he has given her a goal of left knee ROM 0-115 within the next month, but wants to see her again in 2 wks to ensure continued progress. If ROM not continuing to improve by next MD visit, will be scheduled for a manipulation on 11/20/15. MD did clear her to start driving.   Currently in Pain? Yes   Pain Score --  2-3/10   Pain Location Knee   Pain Orientation Left          Today's Treatment  AROM/PROM check L knee MMT L LE Goal Assessment  TherEx NuStep - lvl 5 x 8' (emphasis on stretch into L knee flexion)  Manual L knee grade 3-4 flexion mobs (pt with limited tolerance) L knee superior/inferior & medial patellar mobs Scar tissue massage initiated with patient instructed in technique to continue at home MFR/STM to L quad tendon and L ITB  TherEx AAROM Peanut ball tuck for L knee flexion x15, 3" hold Bridge with feet on peanut ball 15x3" Alternating Step  over 1/2 foam roll focusing on SL stability, hip/knee flexion for improved foot clearance and eccentric control during weight acceptance x10 B 4" Fwd Step-up with HHA on counter x10 6" Fwd Step-up with HHA on counter x10 6" Lat Step-up with HHA on counter x10  Vasopnuematic compression to L knee with lower leg elevated on bolster - medium compression, lowest temp x10'           PT Short Term Goals - 10/21/15 1601    PT SHORT TERM GOAL #1   Title Patient will be independent with initial HEP by 11/06/15   Status Achieved           PT Long Term Goals - 11/04/15 1444    PT LONG TERM GOAL #1   Title Pt will be independent with final HEP by 12/04/15   Status On-going   PT LONG TERM GOAL #2   Title Pt will demonstrate left knee AROM 5-110 or greater by 12/04/15   Status On-going   PT LONG TERM GOAL #3   Title Pt will  demonstrate left knee and hip strength 4-/5 or greater by 12/04/15   Status On-going   PT LONG TERM GOAL #4   Title Pt will ambulate with normal gait pattern without AD by 12/04/15   Status On-going   PT LONG TERM GOAL #5   Title Pt will report no limitation in daily mobility or household tasks due to left knee pain or weakness by 12/04/15   Status On-going               Plan - 11/07/15 1604    Clinical Impression Statement Patient reports MD trimmed scabs from surgical incision with only small section near distal patella still with scabbing present therefore in structed patient in scar tissue massage to promote improved skin translation over unlying tissue and allow for increased ROM. Pt resistant to full pressure as she is with mobs and PROM, limiting progression of ROM. Introduced stepping strategies to decrease sensation of knee buckling as well as forward and lateral step-ups with increasing patient confidence with increased reps.   PT Next Visit Plan aggressive ROM; quad control exercises; hip/knee strengthening; review and update HEP PRN   Consulted and Agree with Plan of Care Patient        Problem List Patient Active Problem List   Diagnosis Date Noted  . 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  [place], [place], [address] Phone: [phone]   Fax:  [phone]  Name: Elizabeth Singh MRN: [id number] Date of Birth: 04/28/1951

## 2015-11-11 ENCOUNTER — Ambulatory Visit: Payer: Medicare HMO | Admitting: Physical Therapy

## 2015-11-11 DIAGNOSIS — M25562 Pain in left knee: Secondary | ICD-10-CM | POA: Diagnosis not present

## 2015-11-11 DIAGNOSIS — R29898 Other symptoms and signs involving the musculoskeletal system: Secondary | ICD-10-CM

## 2015-11-11 DIAGNOSIS — R269 Unspecified abnormalities of gait and mobility: Secondary | ICD-10-CM

## 2015-11-11 DIAGNOSIS — R262 Difficulty in walking, not elsewhere classified: Secondary | ICD-10-CM

## 2015-11-11 DIAGNOSIS — M25662 Stiffness of left knee, not elsewhere classified: Secondary | ICD-10-CM

## 2015-11-11 NOTE — Therapy (Signed)
[facility] [address]  [place], [place], [address] Phone: [phone]   Fax:  [phone]  Physical Therapy Treatment  Patient Details  Name: Elizabeth Singh MRN: [id number] Date of Birth: Jan 22, 1951 Referring Provider: [name], MD  Encounter Date: 11/11/2015      PT End of Session - 11/11/15 1413    Visit Number 12   Number of Visits 19   Date for PT Re-Evaluation 12/04/15   PT Start Time 18  Pt arrived late   PT Stop Time 1503   PT Time Calculation (min) 55 min   Activity Tolerance Patient tolerated treatment well;Patient limited by pain   Behavior During Therapy [facility] for tasks assessed/performed      Past Medical History  Diagnosis Date  . PONV (postoperative nausea and vomiting)     once post surgery  . Hypertension   . High cholesterol   . Arthritis   . Joint pain   . GERD (gastroesophageal reflux disease)   . H/O hiatal hernia   . Thyroid disorder     benign goiter  . Glaucoma   . PVC (premature ventricular contraction)     hx of - 2002 potassium was low  . Hx: UTI (urinary tract infection)     2011 - frequent uti's (after stopping water aerobics the uti's stopped)  . Headache     hx of migraines (none in years)  . Fatty liver   . Diverticulosis     Past Surgical History  Procedure Laterality Date  . Cholecystectomy  1974  . Tubal ligation  1978  . Breast surgery  2002    left benign lumpectomy  . Knee arthroscopy  2001    right  . Cardiac catheterization  2002  . Total knee arthroplasty Right 01/23/2013    Procedure: TOTAL KNEE ARTHROPLASTY;  Surgeon: Vickey Huger, MD;  Location: [facility];  Service: Orthopedics;  Laterality: Right;  . Colonoscopy    . Appendectomy    . Total knee arthroplasty Left 09/23/2015    Procedure: LEFT TOTAL KNEE ARTHROPLASTY;  Surgeon: Vickey Huger, MD;  Location: [facility];  Service: Orthopedics;  Laterality: Left;    There were no vitals filed for this  visit.  Visit Diagnosis:  Left knee pain  Stiffness of left knee  Weakness of left leg  Difficulty walking  Abnormality of gait      Subjective Assessment - 11/11/15 1411    Subjective Patient reports she took a road trip to [place] this weekend. Did well with the drive there but had difficulty on ride home after walking around in [place]. States knee feels stiff today.   Currently in Pain? Yes   Pain Score --  1-2/10   Pain Location Knee   Pain Orientation Left            [facility] PT Assessment - 11/11/15 1408    ROM / Strength   AROM / PROM / Strength AROM   AROM   AROM Assessment Site Knee   Right/Left Knee Left   Left Knee Flexion 95       Today's Treatment  TherEx Rec Bike - fwd/back rocking for increased flexion ROM x 5'   Manual L knee grade 3-4 flexion mobs (pt with limited tolerance) L knee superior/inferior & medial patellar mobs MFR/STM to L quad tendon and L ITB  TherEx Alternating Step over 1/2 foam roll focusing on SL stability, hip/knee flexion for improved foot clearance and  eccentric control during weight acceptance x10 B 6" L Fwd Step-up with HHA on counter x12 6" L Lat Step-up with HHA on counter x12 TRX squat x12 BATCA Knee Flexion B 15# x10 BATCA Leg Press B 15# 2x10  Vasopnuematic compression to L knee with lower leg elevated on bolster - medium compression, lowest temp x10'           PT Short Term Goals - 10/21/15 1601    PT SHORT TERM GOAL #1   Title Patient will be independent with initial HEP by 11/06/15   Status Achieved           PT Long Term Goals - 11/11/15 1521    PT LONG TERM GOAL #1   Title Pt will be independent with final HEP by 12/04/15   Status On-going   PT LONG TERM GOAL #2   Title Pt will demonstrate left knee AROM 5-110 or greater by 12/04/15   Status On-going   PT LONG TERM GOAL #3   Title Pt will demonstrate left knee and hip strength 4-/5 or greater by 12/04/15   Status On-going   PT LONG TERM  GOAL #4   Title Pt will ambulate with normal gait pattern without AD by 12/04/15   Status On-going   PT LONG TERM GOAL #5   Title Pt will report no limitation in daily mobility or household tasks due to left knee pain or weakness by 12/04/15   Status On-going               Plan - 11/11/15 1503    Clinical Impression Statement Patient reporting increased stiffness after weekend trip where she was walking around more and riding in the car for 90 minutes each way. Patient with increased difficulty initiating active knee extension in LAQ position due to increased knee pain and tighter in flexion ROM with pt only able to achieve 95 dg active flexion.   PT Next Visit Plan aggressive ROM; quad control exercises; hip/knee strengthening; review and update HEP PRN   Consulted and Agree with Plan of Care Patient        Problem List Patient Active Problem List   Diagnosis Date Noted  . S/P total knee arthroplasty 09/23/2015    Percival Spanish, PT, MPT 11/11/2015, 3:35 PM  [facility] [address]  [place], [place], [address] Phone: [phone]   Fax:  [phone]  Name: Elizabeth Singh MRN: [id number] Date of Birth: 09-27-1950

## 2015-11-14 ENCOUNTER — Ambulatory Visit: Payer: Medicare HMO | Admitting: Physical Therapy

## 2015-11-14 DIAGNOSIS — R269 Unspecified abnormalities of gait and mobility: Secondary | ICD-10-CM

## 2015-11-14 DIAGNOSIS — R29898 Other symptoms and signs involving the musculoskeletal system: Secondary | ICD-10-CM

## 2015-11-14 DIAGNOSIS — M25662 Stiffness of left knee, not elsewhere classified: Secondary | ICD-10-CM

## 2015-11-14 DIAGNOSIS — M25562 Pain in left knee: Secondary | ICD-10-CM

## 2015-11-14 DIAGNOSIS — R262 Difficulty in walking, not elsewhere classified: Secondary | ICD-10-CM

## 2015-11-14 NOTE — Therapy (Signed)
[facility] [address]  [place], [place], [address] Phone: [phone]   Fax:  [phone]  Physical Therapy Treatment  Patient Details  Name: Elizabeth Singh MRN: [id number] Date of Birth: April 22, 1951 Referring Provider: [name], MD  Encounter Date: 11/14/2015      PT End of Session - 11/14/15 1411    Visit Number 13   Number of Visits 19   Date for PT Re-Evaluation 12/04/15   PT Start Time 1400   PT Stop Time 1459   PT Time Calculation (min) 59 min   Activity Tolerance Patient tolerated treatment well;Patient limited by pain   Behavior During Therapy [facility] for tasks assessed/performed      Past Medical History  Diagnosis Date  . PONV (postoperative nausea and vomiting)     once post surgery  . Hypertension   . High cholesterol   . Arthritis   . Joint pain   . GERD (gastroesophageal reflux disease)   . H/O hiatal hernia   . Thyroid disorder     benign goiter  . Glaucoma   . PVC (premature ventricular contraction)     hx of - 2002 potassium was low  . Hx: UTI (urinary tract infection)     2011 - frequent uti's (after stopping water aerobics the uti's stopped)  . Headache     hx of migraines (none in years)  . Fatty liver   . Diverticulosis     Past Surgical History  Procedure Laterality Date  . Cholecystectomy  1974  . Tubal ligation  1978  . Breast surgery  2002    left benign lumpectomy  . Knee arthroscopy  2001    right  . Cardiac catheterization  2002  . Total knee arthroplasty Right 01/23/2013    Procedure: TOTAL KNEE ARTHROPLASTY;  Surgeon: Vickey Huger, MD;  Location: [place];  Service: Orthopedics;  Laterality: Right;  . Colonoscopy    . Appendectomy    . Total knee arthroplasty Left 09/23/2015    Procedure: LEFT TOTAL KNEE ARTHROPLASTY;  Surgeon: Vickey Huger, MD;  Location: [place];  Service: Orthopedics;  Laterality: Left;    There were no vitals filed for this visit.  Visit  Diagnosis:  Left knee pain  Stiffness of left knee  Weakness of left leg  Difficulty walking  Abnormality of gait      Subjective Assessment - 11/14/15 1409    Subjective Pt reports she feels like she's regressing, noting increased swelling and tightness with dercreased ROM and more pain at night (up to 4/10) starting to interfere with sleep.   Currently in Pain? Yes   Pain Score 2    Pain Location Knee   Pain Orientation Left   Pain Descriptors / Indicators Tightness  Stiffness           Today's Treatment  TherEx NuStep - lvl 5 x 5' (emphasis on stretch into L knee flexion)  Manual L knee grade 3-4 flexion mobs (pt with limited tolerance d/t anterior/lateral knee pain) L knee superior/inferior & medial patellar mobs (limited tolerance d/t ttp at superior and lateral poles of patella) MFR/STM to L quad tendon and L ITB  Kinesiotape - 1 strip web technique (10-20% stretch) from distal patella tendon to superior to quad tendon  TherEx AROM Peanut ball tuck for L knee flexion x20, 5" hold with manual overpressure by PT at full flexion ROM Alternating quad set/hip extension isometric into peanut ball 20x3"  Vasopnuematic  compression to L knee with lower leg elevated on bolster - medium compression, lowest temp x12'           PT Short Term Goals - 10/21/15 1601    PT SHORT TERM GOAL #1   Title Patient will be independent with initial HEP by 11/06/15   Status Achieved           PT Long Term Goals - 11/11/15 1521    PT LONG TERM GOAL #1   Title Pt will be independent with final HEP by 12/04/15   Status On-going   PT LONG TERM GOAL #2   Title Pt will demonstrate left knee AROM 5-110 or greater by 12/04/15   Status On-going   PT LONG TERM GOAL #3   Title Pt will demonstrate left knee and hip strength 4-/5 or greater by 12/04/15   Status On-going   PT LONG TERM GOAL #4   Title Pt will ambulate with normal gait pattern without AD by 12/04/15   Status  On-going   PT LONG TERM GOAL #5   Title Pt will report no limitation in daily mobility or household tasks due to left knee pain or weakness by 12/04/15   Status On-going               Plan - 11/14/15 1545    Clinical Impression Statement Pt frustrated and feeling like she has been regressing this week with increased pain and swelling feeling like it restricting her from bending her knee at well as previously. Pt demonstrating increased antalgic gait pattern and decreased tolerance for manual therapy due to increased ttp especially over mid/superior/lateral patella. Trial of web technique kinesiotaping initiated in attempt to reduce edema and anterio knee pain; will assess response at next visit.   PT Next Visit Plan Assess response to taping, aggressive ROM; quad control exercises; hip/knee strengthening; review and update HEP PRN, **MD note   Consulted and Agree with Plan of Care Patient        Problem List Patient Active Problem List   Diagnosis Date Noted  . 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

## 2015-11-18 ENCOUNTER — Ambulatory Visit: Payer: Medicare HMO | Admitting: Physical Therapy

## 2015-11-18 DIAGNOSIS — M25562 Pain in left knee: Secondary | ICD-10-CM | POA: Diagnosis not present

## 2015-11-18 DIAGNOSIS — R29898 Other symptoms and signs involving the musculoskeletal system: Secondary | ICD-10-CM

## 2015-11-18 DIAGNOSIS — R262 Difficulty in walking, not elsewhere classified: Secondary | ICD-10-CM

## 2015-11-18 DIAGNOSIS — M25662 Stiffness of left knee, not elsewhere classified: Secondary | ICD-10-CM

## 2015-11-18 DIAGNOSIS — R269 Unspecified abnormalities of gait and mobility: Secondary | ICD-10-CM

## 2015-11-18 NOTE — Therapy (Signed)
[facility] [address]  [place], [place], [address] Phone: [phone]   Fax:  [phone]  Physical Therapy Treatment  Patient Details  Name: Elizabeth Singh MRN: [id number] Date of Birth: 1950-12-27 Referring Provider: [name], MD  Encounter Date: 11/18/2015      PT End of Session - 11/18/15 1407    Visit Number 14   Number of Visits 19   Date for PT Re-Evaluation 12/04/15   PT Start Time 1401   PT Stop Time 1453   PT Time Calculation (min) 52 min   Activity Tolerance Patient limited by pain   Behavior During Therapy [facility] for tasks assessed/performed;Anxious      Past Medical History  Diagnosis Date  . PONV (postoperative nausea and vomiting)     once post surgery  . Hypertension   . High cholesterol   . Arthritis   . Joint pain   . GERD (gastroesophageal reflux disease)   . H/O hiatal hernia   . Thyroid disorder     benign goiter  . Glaucoma   . PVC (premature ventricular contraction)     hx of - 2002 potassium was low  . Hx: UTI (urinary tract infection)     2011 - frequent uti's (after stopping water aerobics the uti's stopped)  . Headache     hx of migraines (none in years)  . Fatty liver   . Diverticulosis     Past Surgical History  Procedure Laterality Date  . Cholecystectomy  1974  . Tubal ligation  1978  . Breast surgery  2002    left benign lumpectomy  . Knee arthroscopy  2001    right  . Cardiac catheterization  2002  . Total knee arthroplasty Right 01/23/2013    Procedure: TOTAL KNEE ARTHROPLASTY;  Surgeon: Vickey Huger, MD;  Location: [place];  Service: Orthopedics;  Laterality: Right;  . Colonoscopy    . Appendectomy    . Total knee arthroplasty Left 09/23/2015    Procedure: LEFT TOTAL KNEE ARTHROPLASTY;  Surgeon: Vickey Huger, MD;  Location: [facility];  Service: Orthopedics;  Laterality: Left;    There were no vitals filed for this visit.  Visit Diagnosis:  Left knee  pain  Stiffness of left knee  Weakness of left leg  Difficulty walking  Abnormality of gait      Subjective Assessment - 11/18/15 1405    Subjective Patient reports she tried taking it eaier over the weekend but still feels like the knee is just getting stiffer. Removed kinesiotape on Saturday due to irritation.   Currently in Pain? Yes   Pain Score 3    Pain Location Knee   Pain Orientation Left;Anterior;Distal;Lateral   Pain Descriptors / Indicators Tightness            [facility] PT Assessment - 11/18/15 1401    Assessment   Medical Diagnosis Left TKR   Next MD Visit 11/19/15   ROM / Strength   AROM / PROM / Strength AROM   AROM   AROM Assessment Site Knee   Right/Left Knee Left   Left Knee Extension 15   Left Knee Flexion 93   PROM   PROM Assessment Site Knee   Right/Left Knee Left   Left Knee Extension 0   Left Knee Flexion 94          Today's Treatment  TherEx NuStep - lvl 5 x 5' (emphasis on stretch into L knee flexion)  Manual  L knee grade 3-4 flexion mobs in seated and supine (pt with limited tolerance d/t anterior/lateral knee pain) L knee superior/inferior & medial patellar mobs (limited tolerance d/t ttp at superior and lateral poles of patella) Scar tissue massage (limited tolerance d/t pain/hypersensitivty over upper 1/3 of incision) Manual stretches to L quad in prone, L ITB & rectus femoris in R side-lying 2x30" each (limited tolerance d/t pain) MFR/STM to L quad tendon and L ITB  TherEx AROM Peanut ball tuck for L knee flexion x20, 5" hold with manual overpressure by PT at full flexion ROM Alternating quad set/hip extension isometric into peanut ball 20x3" Bridge with feet on peanut ball 15x3"  Vasopnuematic compression to L knee with lower leg elevated on bolster - medium compression, lowest temp x10'           PT Short Term Goals - 10/21/15 1601    PT SHORT TERM GOAL #1   Title Patient will be independent with initial HEP by  11/06/15   Status Achieved           PT Long Term Goals - 11/18/15 1452    PT LONG TERM GOAL #1   Title Pt will be independent with final HEP by 12/04/15   Status On-going   PT LONG TERM GOAL #2   Title Pt will demonstrate left knee AROM 5-110 or greater by 12/04/15   Status On-going   PT LONG TERM GOAL #3   Title Pt will demonstrate left knee and hip strength 4-/5 or greater by 12/04/15   Status On-going   PT LONG TERM GOAL #4   Title Pt will ambulate with normal gait pattern without AD by 12/04/15   Status On-going   PT LONG TERM GOAL #5   Title Pt will report no limitation in daily mobility or household tasks due to left knee pain or weakness by 12/04/15   Status On-going               Plan - 11/18/15 1444    Clinical Impression Statement Patient with increasing pain and decreasing tolerance from AROM and/or manual therapy including joint mobs, patella mobs, STM and stretching over last few visits with L knee AROM decreased to 15-93 degrees and PROM to 0-94 degrees. Kinesiotaping attempted at last visit to reduce pain/edema also not well tolerated due to sensitivity to the adhesive. Per patient report of MD plan, anticipate patient may undergo manipulation on Wednesday, therefore added additional visit for Friday 3/3 (already scheduled to return to PT on 3/2).   PT Next Visit Plan Aggressive ROM; quad control exercises; hip/knee strengthening; review and update HEP PRN   Consulted and Agree with Plan of Care Patient        Problem List Patient Active Problem List   Diagnosis Date Noted  . 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

## 2015-11-21 ENCOUNTER — Ambulatory Visit: Payer: Medicare HMO | Attending: Orthopedic Surgery | Admitting: Physical Therapy

## 2015-11-21 DIAGNOSIS — M6281 Muscle weakness (generalized): Secondary | ICD-10-CM | POA: Insufficient documentation

## 2015-11-21 DIAGNOSIS — R262 Difficulty in walking, not elsewhere classified: Secondary | ICD-10-CM | POA: Diagnosis present

## 2015-11-21 DIAGNOSIS — M25662 Stiffness of left knee, not elsewhere classified: Secondary | ICD-10-CM | POA: Insufficient documentation

## 2015-11-21 DIAGNOSIS — R269 Unspecified abnormalities of gait and mobility: Secondary | ICD-10-CM | POA: Diagnosis present

## 2015-11-21 DIAGNOSIS — R29898 Other symptoms and signs involving the musculoskeletal system: Secondary | ICD-10-CM

## 2015-11-21 DIAGNOSIS — M25562 Pain in left knee: Secondary | ICD-10-CM

## 2015-11-21 NOTE — Therapy (Signed)
[facility] [address]  [place], [place], [address] Phone: [phone]   Fax:  [phone]  Physical Therapy Treatment  Patient Details  Name: Elizabeth Singh MRN: [id number] Date of Birth: 1951/01/30 Referring Provider: [name], MD  Encounter Date: 11/21/2015      PT End of Session - 11/21/15 1413    Visit Number 15   Number of Visits 26   Date for PT Re-Evaluation 12/20/15   PT Start Time [id number]   PT Stop Time 1504   PT Time Calculation (min) 61 min   Activity Tolerance Patient tolerated treatment well;Patient limited by pain   Behavior During Therapy [facility] for tasks assessed/performed      Past Medical History  Diagnosis Date  . PONV (postoperative nausea and vomiting)     once post surgery  . Hypertension   . High cholesterol   . Arthritis   . Joint pain   . GERD (gastroesophageal reflux disease)   . H/O hiatal hernia   . Thyroid disorder     benign goiter  . Glaucoma   . PVC (premature ventricular contraction)     hx of - 2002 potassium was low  . Hx: UTI (urinary tract infection)     2011 - frequent uti's (after stopping water aerobics the uti's stopped)  . Headache     hx of migraines (none in years)  . Fatty liver   . Diverticulosis     Past Surgical History  Procedure Laterality Date  . Cholecystectomy  1974  . Tubal ligation  1978  . Breast surgery  2002    left benign lumpectomy  . Knee arthroscopy  2001    right  . Cardiac catheterization  2002  . Total knee arthroplasty Right 01/23/2013    Procedure: TOTAL KNEE ARTHROPLASTY;  Surgeon: Vickey Huger, MD;  Location: [place];  Service: Orthopedics;  Laterality: Right;  . Colonoscopy    . Appendectomy    . Total knee arthroplasty Left 09/23/2015    Procedure: LEFT TOTAL KNEE ARTHROPLASTY;  Surgeon: Vickey Huger, MD;  Location: [facility];  Service: Orthopedics;  Laterality: Left;    There were no vitals filed for this visit.  Visit  Diagnosis:  Left knee pain  Stiffness of left knee  Weakness of left leg  Difficulty walking  Abnormality of gait      Subjective Assessment - 11/21/15 1408    Subjective Patient reports she had her L knee manipulated yesterday, and per dtr's report, MD able to flex knee until back of calf touched back of thigh. Pt reports one spot of intense pain (10/10) when L knee touched over distal incision/lateral tibial tuberosity.   Patient Stated Goals "To get the ROM back and the strength so I feel like I can control the knee when I walk."   Currently in Pain? Yes   Pain Score 3    Pain Location Knee   Pain Orientation Left            [facility] PT Assessment - 11/21/15 1403    Assessment   Medical Diagnosis Left TKR   Next MD Visit 12/03/15   [place] residence   Living Arrangements Alone   Type of [place] Access Level entry   [place] One level   Prior Function   Level of Independence Independent   Vocation Retired   Conservator, museum/gallery cards, activities with friends, dance,  gym 3-4x/wk, recumbent bike 5 mi/day during the week   ROM / Strength   AROM / PROM / Strength AROM;PROM   AROM   AROM Assessment Site Knee   Right/Left Knee Left   Left Knee Extension 20   Left Knee Flexion 103   PROM   PROM Assessment Site Knee   Right/Left Knee Left   Left Knee Extension 0   Left Knee Flexion 106          Today's Treatment  TherEx NuStep - lvl 3 x 8' (emphasis on stretch into L knee flexion) AROM Peanut ball tuck for L knee flexion 2x20, 5" hold with manual overpressure by PT at full flexion ROM on 2nd set AAROM Heel Slides with strap x20  Seated Heel Slides 2x20 Step Stretch for L Knee Flexion with foot on BATCA shoulder press seat at lowest height 10x3" BATCA Knee Flexion 15# 2x10   Vasopnuematic compression to L knee with lower leg elevated on bolster - medium compression, lowest temp x15'          PT Short Term  Goals - 10/21/15 1601    PT SHORT TERM GOAL #1   Title Patient will be independent with initial HEP by 11/06/15   Status Achieved           PT Long Term Goals - 11/21/15 1504    PT LONG TERM GOAL #1   Title Pt will be independent with final HEP by 12/20/15   Status On-going   PT LONG TERM GOAL #2   Title Pt will demonstrate left knee AROM 5-110 or greater by 12/20/15   Status On-going   PT LONG TERM GOAL #3   Title Pt will demonstrate left knee and hip strength 4-/5 or greater by 12/20/15   Status On-going   PT LONG TERM GOAL #4   Title Pt will ambulate with normal gait pattern without AD by 12/20/15   Status On-going   PT LONG TERM GOAL #5   Title Pt will report no limitation in daily mobility or household tasks due to left knee pain or weakness by 12/20/15   Status On-going               Plan - 11/21/15 1456    Clinical Impression Statement Patient s/p manipulation under anesthesia yesterday with full ROM achieved per pt report. Returns to therapy today with L knee AROM 20-103 and PROM 0-106, with pain/bruising over distal incision/lateral tibial tuberosity the greatest limiting factor. Today's treatment emphasized flexion ROM and stretching with patient encouraged to continue working on flexion ROM 5x/day at home. Given manipulation yesterday with continued limitation in flexion ROM and continued significant lack of TKE (-20 dg today), will recert with increased frequency to 3x/wk for next 2 wks returning to 2x/wk x additional 2 weeks.   Pt will benefit from skilled therapeutic intervention in order to improve on the following deficits Pain;Decreased range of motion;Decreased strength;Impaired flexibility;Difficulty walking;Abnormal gait;Decreased activity tolerance;Decreased balance;Impaired perceived functional ability   Rehab Potential Fair   PT Frequency 3x / week  decreasing to 2x/wk after 2 weeks   PT Duration 4 weeks   PT Treatment/Interventions Therapeutic  exercise;Manual techniques;Passive range of motion;Scar mobilization;Therapeutic activities;Gait training;Stair training;Electrical Stimulation;Cryotherapy;Vasopneumatic Device;Taping;Iontophoresis 4mg /ml Dexamethasone;Balance training;Patient/family education   PT Next Visit Plan Aggressive ROM; quad control exercises; hip/knee strengthening; review and update HEP PRN   Consulted and Agree with Plan of Care Patient        Problem List Patient Active Problem  List   Diagnosis Date Noted  . 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

## 2015-11-22 ENCOUNTER — Ambulatory Visit: Payer: Medicare HMO

## 2015-11-22 DIAGNOSIS — R29898 Other symptoms and signs involving the musculoskeletal system: Secondary | ICD-10-CM

## 2015-11-22 DIAGNOSIS — M25662 Stiffness of left knee, not elsewhere classified: Secondary | ICD-10-CM

## 2015-11-22 DIAGNOSIS — M25562 Pain in left knee: Secondary | ICD-10-CM

## 2015-11-22 DIAGNOSIS — R269 Unspecified abnormalities of gait and mobility: Secondary | ICD-10-CM

## 2015-11-22 DIAGNOSIS — R262 Difficulty in walking, not elsewhere classified: Secondary | ICD-10-CM

## 2015-11-22 NOTE — Therapy (Signed)
[facility] [address]  [place], [place], [address] Phone: [phone]   Fax:  [phone]  Physical Therapy Treatment  Patient Details  Name: Elizabeth Singh MRN: [id number] Date of Birth: 05-13-1951 Referring Provider: [name], MD  Encounter Date: 11/22/2015      PT End of Session - 11/22/15 1129    Visit Number 16   Number of Visits 26   Date for PT Re-Evaluation 12/20/15   PT Start Time 1104   PT Stop Time 1203   PT Time Calculation (min) 59 min   Activity Tolerance Patient tolerated treatment well;Patient limited by pain   Behavior During Therapy [facility] for tasks assessed/performed      Past Medical History  Diagnosis Date  . PONV (postoperative nausea and vomiting)     once post surgery  . Hypertension   . High cholesterol   . Arthritis   . Joint pain   . GERD (gastroesophageal reflux disease)   . H/O hiatal hernia   . Thyroid disorder     benign goiter  . Glaucoma   . PVC (premature ventricular contraction)     hx of - 2002 potassium was low  . Hx: UTI (urinary tract infection)     2011 - frequent uti's (after stopping water aerobics the uti's stopped)  . Headache     hx of migraines (none in years)  . Fatty liver   . Diverticulosis     Past Surgical History  Procedure Laterality Date  . Cholecystectomy  1974  . Tubal ligation  1978  . Breast surgery  2002    left benign lumpectomy  . Knee arthroscopy  2001    right  . Cardiac catheterization  2002  . Total knee arthroplasty Right 01/23/2013    Procedure: TOTAL KNEE ARTHROPLASTY;  Surgeon: Vickey Huger, MD;  Location: [facility];  Service: Orthopedics;  Laterality: Right;  . Colonoscopy    . Appendectomy    . Total knee arthroplasty Left 09/23/2015    Procedure: LEFT TOTAL KNEE ARTHROPLASTY;  Surgeon: Vickey Huger, MD;  Location: [facility];  Service: Orthopedics;  Laterality: Left;    There were no vitals filed for this visit.  Visit  Diagnosis:  Left knee pain  Stiffness of left knee  Weakness of left leg  Difficulty walking  Abnormality of gait      Subjective Assessment - 11/22/15 1123    Subjective Pt. continues to report L anterior inferior knee bruising since manipulation   Currently in Pain? Yes   Pain Score 3    Pain Orientation Left            [facility] PT Assessment - 11/21/15 1403    Assessment   Medical Diagnosis Left TKR   Next MD Visit 12/03/15   [place] residence   Living Arrangements Alone   Type of [place] Access Level entry   [place] One level   Prior Function   Level of [place] Retired   Conservator, museum/gallery cards, activities with friends, dance, gym 3-4x/wk, recumbent bike 5 mi/day during the week   ROM / Strength   AROM / PROM / Strength AROM;PROM   AROM   AROM Assessment Site Knee   Right/Left Knee Left   Left Knee Extension 20   Left Knee Flexion 103   PROM   PROM Assessment Site Knee   Right/Left Knee Left  Left Knee Extension 0   Left Knee Flexion 106      Therex: NuStep 5 min level 3  Seated clam shell with green TB x 15 reps  Supine clam shell with green TB x 15 reps  L SLR with 5 sec eccentric lowering with strap  Seated hip adductor green ball squeeze x 15 reps AAROM heel slides with green strap x 10 reps  Fitter seated leg press knee ext. 2 x 15 reps  Vasoneumatic device: supine, L knee, medium pressure, lowest temp, and 10 min         PT Short Term Goals - 10/21/15 1601    PT SHORT TERM GOAL #1   Title Patient will be independent with initial HEP by 11/06/15   Status Achieved           PT Long Term Goals - 11/21/15 1504    PT LONG TERM GOAL #1   Title Pt will be independent with final HEP by 12/20/15   Status On-going   PT LONG TERM GOAL #2   Title Pt will demonstrate left knee AROM 5-110 or greater by 12/20/15   Status On-going   PT LONG TERM GOAL #3   Title Pt will  demonstrate left knee and hip strength 4-/5 or greater by 12/20/15   Status On-going   PT LONG TERM GOAL #4   Title Pt will ambulate with normal gait pattern without AD by 12/20/15   Status On-going   PT LONG TERM GOAL #5   Title Pt will report no limitation in daily mobility or household tasks due to left knee pain or weakness by 12/20/15   Status On-going           Plan - 11/22/15 1130    Clinical Impression Statement Pt. with continued bruising around L knee.  Pt. tolerated all hip / knee strengthening well today with only minimal increase in L knee pain with flexion activities.  Pt. continues to require increased time with all activity demonstrating apprehension with knee flexion activity.     PT Next Visit Plan  Record frequent L knee ROM.  Aggressive ROM; quad control exercises; hip/knee strengthening; review and update HEP PRN.      Problem List Patient Active Problem List   Diagnosis Date Noted  . 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  [place], [place], [address] Phone: [phone]   Fax:  [phone]  Name: Elizabeth Singh MRN: [id number] Date of Birth: 1951/07/07

## 2015-11-26 ENCOUNTER — Ambulatory Visit: Payer: Medicare HMO | Admitting: Physical Therapy

## 2015-11-26 DIAGNOSIS — M25662 Stiffness of left knee, not elsewhere classified: Secondary | ICD-10-CM

## 2015-11-26 DIAGNOSIS — M25562 Pain in left knee: Secondary | ICD-10-CM | POA: Diagnosis not present

## 2015-11-26 DIAGNOSIS — R262 Difficulty in walking, not elsewhere classified: Secondary | ICD-10-CM

## 2015-11-26 DIAGNOSIS — R29898 Other symptoms and signs involving the musculoskeletal system: Secondary | ICD-10-CM

## 2015-11-26 DIAGNOSIS — R269 Unspecified abnormalities of gait and mobility: Secondary | ICD-10-CM

## 2015-11-26 NOTE — Therapy (Signed)
[facility] [address]  [place], [place], [address] Phone: [phone]   Fax:  [phone]  Physical Therapy Treatment  Patient Details  Name: Elizabeth Singh MRN: [id number] Date of Birth: 24-Apr-1951 Referring Provider: [name], MD  Encounter Date: 11/26/2015      PT End of Session - 11/26/15 1024    Visit Number 17   Number of Visits 26   Date for PT Re-Evaluation 12/20/15   PT Start Time 1018   PT Stop Time 1117   PT Time Calculation (min) 59 min   Activity Tolerance Patient tolerated treatment well   Behavior During Therapy [facility] for tasks assessed/performed      Past Medical History  Diagnosis Date  . PONV (postoperative nausea and vomiting)     once post surgery  . Hypertension   . High cholesterol   . Arthritis   . Joint pain   . GERD (gastroesophageal reflux disease)   . H/O hiatal hernia   . Thyroid disorder     benign goiter  . Glaucoma   . PVC (premature ventricular contraction)     hx of - 2002 potassium was low  . Hx: UTI (urinary tract infection)     2011 - frequent uti's (after stopping water aerobics the uti's stopped)  . Headache     hx of migraines (none in years)  . Fatty liver   . Diverticulosis     Past Surgical History  Procedure Laterality Date  . Cholecystectomy  1974  . Tubal ligation  1978  . Breast surgery  2002    left benign lumpectomy  . Knee arthroscopy  2001    right  . Cardiac catheterization  2002  . Total knee arthroplasty Right 01/23/2013    Procedure: TOTAL KNEE ARTHROPLASTY;  Surgeon: Vickey Huger, MD;  Location: [facility];  Service: Orthopedics;  Laterality: Right;  . Colonoscopy    . Appendectomy    . Total knee arthroplasty Left 09/23/2015    Procedure: LEFT TOTAL KNEE ARTHROPLASTY;  Surgeon: Vickey Huger, MD;  Location: [facility];  Service: Orthopedics;  Laterality: Left;    There were no vitals filed for this visit.  Visit Diagnosis:  Left knee  pain  Stiffness of left knee  Weakness of left leg  Difficulty walking  Abnormality of gait      Subjective Assessment - 11/26/15 1022    Subjective Pt reports icing frequently over the weekend and the pain that had been up to 10/10 at distal knee is now only 3/10. Reports able to complete full revolutions on home bike over the weekend.   Currently in Pain? Yes   Pain Score 3    Pain Location Knee   Pain Orientation Left   Pain Descriptors / Indicators Sore;Tightness            [facility] PT Assessment - 11/26/15 1018    ROM / Strength   AROM / PROM / Strength AROM;PROM   AROM   AROM Assessment Site Knee   Right/Left Knee Left   Left Knee Extension 20   Left Knee Flexion 103   PROM   PROM Assessment Site Knee   Right/Left Knee Left   Left Knee Extension 0   Left Knee Flexion 107          Today's Treatment  TherEx NuStep L 3 x 5' (emphasis on stretch into L knee flexion) Rec Bike - full revolutions (no resistance) x3' AROM Peanut  ball tuck for L knee flexion 2x20, 5" hold with manual overpressure by PT at full flexion ROM on 2nd set [employer] with feet on peanut ball 2x10 Fitter seated leg press knee ext (2 blue) 2x15 TRX squat x10 6" L Lateral step-up (emphaszing TKE) x15 6" L Forward step-up (emphasizing TKE) x15 Step Stretch for L Knee Flexion with foot on BATCA shoulder press seat at lowest height 10x3"  ROM check  Vasopnuematic compression to L knee with lower leg elevated on bolster - medium compression, lowest temp x15'          PT Short Term Goals - 10/21/15 1601    PT SHORT TERM GOAL #1   Title Patient will be independent with initial HEP by 11/06/15   Status Achieved           PT Long Term Goals - 11/26/15 1250    PT LONG TERM GOAL #1   Title Pt will be independent with final HEP by 12/20/15   Status On-going   PT LONG TERM GOAL #2   Title Pt will demonstrate left knee AROM 5-110 or greater by 12/20/15   Status On-going   PT  LONG TERM GOAL #3   Title Pt will demonstrate left knee and hip strength 4-/5 or greater by 12/20/15   Status On-going   PT LONG TERM GOAL #4   Title Pt will ambulate with normal gait pattern without AD by 12/20/15   Status On-going   PT LONG TERM GOAL #5   Title Pt will report no limitation in daily mobility or household tasks due to left knee pain or weakness by 12/20/15   Status On-going               Plan - 11/26/15 1244    Clinical Impression Statement Pt continues to be apprehensive/resistant to more aggressive stretching for L knee flexion ROM with no signficant change since day after manipulattion last week. Decreased edema noted today with pt reporting ability to complete full revolutions on home bike and able to do so as well in therapy today although at slow speed. Resumed more closed chain strengthening today with good tolerance.   PT Next Visit Plan L knee ROM assessment each visit.  Aggressive ROM; quad control exercises emphasizing TKE; hip/knee strengthening; review and update HEP PRN.   Consulted and Agree with Plan of Care Patient        Problem List Patient Active Problem List   Diagnosis Date Noted  . 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  [place], [place], [address] Phone: [phone]   Fax:  [phone]  Name: Elizabeth Singh MRN: [id number] Date of Birth: Jun 29, 1951

## 2015-11-28 ENCOUNTER — Ambulatory Visit: Payer: Medicare HMO | Admitting: Rehabilitative and Restorative Service Providers"

## 2015-11-28 ENCOUNTER — Encounter: Payer: Self-pay | Admitting: Rehabilitative and Restorative Service Providers"

## 2015-11-28 DIAGNOSIS — R269 Unspecified abnormalities of gait and mobility: Secondary | ICD-10-CM

## 2015-11-28 DIAGNOSIS — M25662 Stiffness of left knee, not elsewhere classified: Secondary | ICD-10-CM

## 2015-11-28 DIAGNOSIS — R262 Difficulty in walking, not elsewhere classified: Secondary | ICD-10-CM

## 2015-11-28 DIAGNOSIS — R29898 Other symptoms and signs involving the musculoskeletal system: Secondary | ICD-10-CM

## 2015-11-28 DIAGNOSIS — M25562 Pain in left knee: Secondary | ICD-10-CM | POA: Diagnosis not present

## 2015-11-28 NOTE — Patient Instructions (Signed)
Self massage using a rubber ball - about 4 inches is a good size - and "the Stick" (on line or generic at target)   Scar Tissue Massage    Place pad of fingertip on scar area. Apply steady downward pressure while moving in circular fashion. Use another finger on top to assist. Repeat until entire scar has been covered. 5 - 8 minutes. Do _2-3 ___ sessions per day.    Hip Flexor, Quadricep Stretch: Belly Down (Strap)    Engage stable pelvic tilt. Hold top of foot with hand or strap. Hold for __30 sec Repeat __3-5__ times each leg. 2 - 3 times/day   Side    Facing stool or step  place leftfoot on step, bend knee and hold for 20-30 sec  Do _5-10___ repetitions, _2-3 ___ sets.

## 2015-11-28 NOTE — Therapy (Signed)
[facility] [address]  [place], [place], [address] Phone: [phone]   Fax:  [phone]  Physical Therapy Treatment  Patient Details  Name: Elizabeth Singh MRN: [id number] Date of Birth: March 03, 1951 Referring Provider: [name], MD  Encounter Date: 11/28/2015      PT End of Session - 11/28/15 1121    Visit Number 18   Number of Visits 26   Date for PT Re-Evaluation 12/20/15   PT Start Time 1103   PT Stop Time 1206   PT Time Calculation (min) 63 min   Activity Tolerance Patient tolerated treatment well;Patient limited by pain      Past Medical History  Diagnosis Date  . PONV (postoperative nausea and vomiting)     once post surgery  . Hypertension   . High cholesterol   . Arthritis   . Joint pain   . GERD (gastroesophageal reflux disease)   . H/O hiatal hernia   . Thyroid disorder     benign goiter  . Glaucoma   . PVC (premature ventricular contraction)     hx of - 2002 potassium was low  . Hx: UTI (urinary tract infection)     2011 - frequent uti's (after stopping water aerobics the uti's stopped)  . Headache     hx of migraines (none in years)  . Fatty liver   . Diverticulosis     Past Surgical History  Procedure Laterality Date  . Cholecystectomy  1974  . Tubal ligation  1978  . Breast surgery  2002    left benign lumpectomy  . Knee arthroscopy  2001    right  . Cardiac catheterization  2002  . Total knee arthroplasty Right 01/23/2013    Procedure: TOTAL KNEE ARTHROPLASTY;  Surgeon: Vickey Huger, MD;  Location: [facility];  Service: Orthopedics;  Laterality: Right;  . Colonoscopy    . Appendectomy    . Total knee arthroplasty Left 09/23/2015    Procedure: LEFT TOTAL KNEE ARTHROPLASTY;  Surgeon: Vickey Huger, MD;  Location: [facility];  Service: Orthopedics;  Laterality: Left;    There were no vitals filed for this visit.  Visit Diagnosis:  Left knee pain  Stiffness of left knee  Weakness of  left leg  Difficulty walking  Abnormality of gait      Subjective Assessment - 11/28/15 1112    Subjective On bike at home full revolution yesterday 10 min    Pertinent History Left TKR 09/23/15; Right TKR 01/22/13   Currently in Pain? Yes   Pain Score 3    Pain Location Knee   Pain Orientation Left   Pain Descriptors / Indicators Sore;Tightness   Pain Type Surgical pain   Pain Onset 1 to 4 weeks ago   Pain Frequency Intermittent   Aggravating Factors  activity; bending; prolonged standing    Pain Relieving Factors ice; pain meds             [facility] PT Assessment - 11/28/15 0001    Assessment   Medical Diagnosis Left TKR   Next MD Visit 12/03/15   ROM / Strength   AROM / PROM / Strength AROM;PROM   AROM   AROM Assessment Site Knee   Right/Left Knee Left   Left Knee Flexion 101   PROM   Left Knee Flexion 109                     [facility] Adult PT Treatment/Exercise -  11/28/15 0001    Knee/Hip Exercises: Stretches   Quad Stretch 5 reps;30 seconds   Knee/Hip Exercises: Aerobic   Recumbent Bike ROM full revolutioin x4 min    Nustep L2 x 5 min   Knee/Hip Exercises: Standing   Other Standing Knee Exercises marching high knees touching 12 inch surface x 20 each leg alt    Other Standing Knee Exercises Lt foot on ~18 inch box bending forward to flex knee 10sec hold x 10    Knee/Hip Exercises: Seated   Knee/Hip Flexion PROM L knee with tibia distraction   Modalities   Modalities Vasopneumatic   Vasopneumatic   Number Minutes Vasopneumatic  15 minutes   Vasopnuematic Location  Knee   Vasopneumatic Pressure Medium   Vasopneumatic Temperature  max cold   Manual Therapy   Manual therapy comments manual work through quad - resisted by pt with any pressure near incision and through distal quad - instructed in myofacial release work for home                 PT Education - 11/28/15 1213    Education Details HEP; scar massage; myofacial release using  ball/stick           PT Short Term Goals - 10/21/15 1601    PT SHORT TERM GOAL #1   Title Patient will be independent with initial HEP by 11/06/15   Status Achieved           PT Long Term Goals - 11/26/15 1250    PT LONG TERM GOAL #1   Title Pt will be independent with final HEP by 12/20/15   Status On-going   PT LONG TERM GOAL #2   Title Pt will demonstrate left knee AROM 5-110 or greater by 12/20/15   Status On-going   PT LONG TERM GOAL #3   Title Pt will demonstrate left knee and hip strength 4-/5 or greater by 12/20/15   Status On-going   PT LONG TERM GOAL #4   Title Pt will ambulate with normal gait pattern without AD by 12/20/15   Status On-going   PT LONG TERM GOAL #5   Title Pt will report no limitation in daily mobility or household tasks due to left knee pain or weakness by 12/20/15   Status On-going               Plan - 11/28/15 1207    Clinical Impression Statement Continued difficulty tolerating ROM and moderately aggressive stretching L knee flexion ROM.    Pt will benefit from skilled therapeutic intervention in order to improve on the following deficits Pain;Decreased range of motion;Decreased strength;Impaired flexibility;Difficulty walking;Abnormal gait;Decreased activity tolerance;Decreased balance;Impaired perceived functional ability        Problem List Patient Active Problem List   Diagnosis Date Noted  . 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

## 2015-11-29 ENCOUNTER — Ambulatory Visit: Payer: Medicare HMO | Admitting: Physical Therapy

## 2015-11-29 DIAGNOSIS — R269 Unspecified abnormalities of gait and mobility: Secondary | ICD-10-CM

## 2015-11-29 DIAGNOSIS — M25562 Pain in left knee: Secondary | ICD-10-CM

## 2015-11-29 DIAGNOSIS — R29898 Other symptoms and signs involving the musculoskeletal system: Secondary | ICD-10-CM

## 2015-11-29 DIAGNOSIS — M25662 Stiffness of left knee, not elsewhere classified: Secondary | ICD-10-CM

## 2015-11-29 DIAGNOSIS — R262 Difficulty in walking, not elsewhere classified: Secondary | ICD-10-CM

## 2015-11-29 NOTE — Therapy (Signed)
[facility] [address]  [place], [place], [address] Phone: [phone]   Fax:  [phone]  Physical Therapy Treatment  Patient Details  Name: Elizabeth Singh MRN: [id number] Date of Birth: May 18, 1951 Referring Provider: [name], MD  Encounter Date: 11/29/2015      PT End of Session - 11/29/15 1018    Visit Number 19   Number of Visits 26   Date for PT Re-Evaluation 12/20/15   PT Start Time [id number]   PT Stop Time 1124   PT Time Calculation (min) 70 min   Activity Tolerance Patient tolerated treatment well   Behavior During Therapy [facility] for tasks assessed/performed      Past Medical History  Diagnosis Date  . PONV (postoperative nausea and vomiting)     once post surgery  . Hypertension   . High cholesterol   . Arthritis   . Joint pain   . GERD (gastroesophageal reflux disease)   . H/O hiatal hernia   . Thyroid disorder     benign goiter  . Glaucoma   . PVC (premature ventricular contraction)     hx of - 2002 potassium was low  . Hx: UTI (urinary tract infection)     2011 - frequent uti's (after stopping water aerobics the uti's stopped)  . Headache     hx of migraines (none in years)  . Fatty liver   . Diverticulosis     Past Surgical History  Procedure Laterality Date  . Cholecystectomy  1974  . Tubal ligation  1978  . Breast surgery  2002    left benign lumpectomy  . Knee arthroscopy  2001    right  . Cardiac catheterization  2002  . Total knee arthroplasty Right 01/23/2013    Procedure: TOTAL KNEE ARTHROPLASTY;  Surgeon: Vickey Huger, MD;  Location: [facility];  Service: Orthopedics;  Laterality: Right;  . Colonoscopy    . Appendectomy    . Total knee arthroplasty Left 09/23/2015    Procedure: LEFT TOTAL KNEE ARTHROPLASTY;  Surgeon: Vickey Huger, MD;  Location: [facility];  Service: Orthopedics;  Laterality: Left;    There were no vitals filed for this visit.  Visit Diagnosis:  Left knee  pain  Stiffness of left knee  Weakness of left leg  Difficulty walking  Abnormality of gait      Subjective Assessment - 11/29/15 1016    Subjective Pt reports L knee feels better today than it has in a week.   Currently in Pain? Yes   Pain Score --  2-3/10   Pain Location Knee   Pain Orientation Left            [facility] PT Assessment - 11/29/15 1014    ROM / Strength   AROM / PROM / Strength AROM   AROM   AROM Assessment Site Knee   Right/Left Knee Left   Left Knee Extension 12   Left Knee Flexion 108   PROM   PROM Assessment Site Knee   Right/Left Knee Left   Left Knee Extension 0   Left Knee Flexion 113          Today's Treatment  TherEx NuStep L4 x 4' (emphasis on stretch into L knee flexion) Rec Bike - L1 (full revolutions)  x4' BATCA Leg Press 15# x15, 20# x15 Step Stretch for L Knee Flexion with foot on BATCA shoulder press seat at lowest height 10x5" Prone quad & RF stretches with strap  3x30' each Fitter seated leg press L knee ext (1 black/1 blue) 2x10 TKE with black TB 2x10 6" L Forward step-up with blue TB TKE x15 6" L Lateral step-up (emphaszing TKE) x15  ROM check  Vasopnuematic compression to L knee with lower leg elevated on bolster - medium compression, lowest temp x15'  Ionto patch (#1 of 6) to L pes anserine bursa - 1 ml Dexamethasone, 80 mA-min x 6 hr             PT Education - 11/28/15 1213    Education Details HEP; scar massage; myofacial release using ball/stick           PT Short Term Goals - 10/21/15 1601    PT SHORT TERM GOAL #1   Title Patient will be independent with initial HEP by 11/06/15   Status Achieved           PT Long Term Goals - 11/26/15 1250    PT LONG TERM GOAL #1   Title Pt will be independent with final HEP by 12/20/15   Status On-going   PT LONG TERM GOAL #2   Title Pt will demonstrate left knee AROM 5-110 or greater by 12/20/15   Status On-going   PT LONG TERM GOAL #3   Title Pt will  demonstrate left knee and hip strength 4-/5 or greater by 12/20/15   Status On-going   PT LONG TERM GOAL #4   Title Pt will ambulate with normal gait pattern without AD by 12/20/15   Status On-going   PT LONG TERM GOAL #5   Title Pt will report no limitation in daily mobility or household tasks due to left knee pain or weakness by 12/20/15   Status On-going               Plan - 11/29/15 1113    Clinical Impression Statement Pt reporting improving knee stability during gait with improving quad control. Demonstrating increasing tolerance for quad & RF stretching allowing for improved flexion ROM. Continues to be painful and very ttp over pes anserine bursa therefore initiated trial of ionto patch and will assess response at next visit.   PT Next Visit Plan *MD note for appt on 12/03/15. Assess response to ionto patch.  L knee ROM assessment each visit.  Aggressive ROM; quad control exercises emphasizing TKE; hip/knee strengthening; review and update HEP PRN. Modalities PRN for pain & edema, including ionto patch (#2 of 6) if benefit noted.   Consulted and Agree with Plan of Care Patient        Problem List Patient Active Problem List   Diagnosis Date Noted  . 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  [place], [place], [address] Phone: [phone]   Fax:  [phone]  Name: Elizabeth Singh MRN: [id number] Date of Birth: 1951-06-08

## 2015-12-02 ENCOUNTER — Ambulatory Visit: Payer: Medicare HMO

## 2015-12-02 DIAGNOSIS — M25562 Pain in left knee: Secondary | ICD-10-CM

## 2015-12-02 DIAGNOSIS — R29898 Other symptoms and signs involving the musculoskeletal system: Secondary | ICD-10-CM

## 2015-12-02 DIAGNOSIS — R269 Unspecified abnormalities of gait and mobility: Secondary | ICD-10-CM

## 2015-12-02 DIAGNOSIS — M25662 Stiffness of left knee, not elsewhere classified: Secondary | ICD-10-CM

## 2015-12-02 DIAGNOSIS — R262 Difficulty in walking, not elsewhere classified: Secondary | ICD-10-CM

## 2015-12-02 NOTE — Therapy (Addendum)
[facility] [address]  [place], [place], [address] Phone: [phone]   Fax:  [phone]  Physical Therapy Treatment  Patient Details  Name: Elizabeth Singh MRN: [id number] Date of Birth: September 17, 1951 Referring Provider: [name], MD  Encounter Date: 12/02/2015      PT End of Session - 12/02/15 1124    Visit Number 20   Number of Visits 26   Date for PT Re-Evaluation 12/20/15   PT Start Time 1019   PT Stop Time 1120   PT Time Calculation (min) 61 min   Activity Tolerance Patient tolerated treatment well   Behavior During Therapy [facility] for tasks assessed/performed      Past Medical History  Diagnosis Date  . PONV (postoperative nausea and vomiting)     once post surgery  . Hypertension   . High cholesterol   . Arthritis   . Joint pain   . GERD (gastroesophageal reflux disease)   . H/O hiatal hernia   . Thyroid disorder     benign goiter  . Glaucoma   . PVC (premature ventricular contraction)     hx of - 2002 potassium was low  . Hx: UTI (urinary tract infection)     2011 - frequent uti's (after stopping water aerobics the uti's stopped)  . Headache     hx of migraines (none in years)  . Fatty liver   . Diverticulosis     Past Surgical History  Procedure Laterality Date  . Cholecystectomy  1974  . Tubal ligation  1978  . Breast surgery  2002    left benign lumpectomy  . Knee arthroscopy  2001    right  . Cardiac catheterization  2002  . Total knee arthroplasty Right 01/23/2013    Procedure: TOTAL KNEE ARTHROPLASTY;  Surgeon: Vickey Huger, MD;  Location: [facility];  Service: Orthopedics;  Laterality: Right;  . Colonoscopy    . Appendectomy    . Total knee arthroplasty Left 09/23/2015    Procedure: LEFT TOTAL KNEE ARTHROPLASTY;  Surgeon: Vickey Huger, MD;  Location: [facility];  Service: Orthopedics;  Laterality: Left;    There were no vitals filed for this visit.  Visit Diagnosis:  Left knee  pain  Stiffness of left knee  Difficulty walking  Abnormality of gait  Weakness of left leg      Subjective Assessment - 12/02/15 1023    Subjective Pt. reports 2/10 L knee pain.  Pt. to see MD tomorrow regarding L knee status.     Patient Stated Goals "To get the ROM back and the strength so I feel like I can control the knee when I walk."   Currently in Pain? Yes   Pain Score 2    Pain Location Knee   Pain Orientation Left   Pain Descriptors / Indicators Sore;Tightness   Pain Type Surgical pain   Pain Radiating Towards n/a   Pain Onset 1 to 4 weeks ago   Pain Frequency Intermittent   Aggravating Factors  activity; bending; prolonged standing   Pain Relieving Factors ice; pain meds   Effect of Pain on Daily Activities limits with walking   Multiple Pain Sites No            [facility] PT Assessment - 12/02/15 1104    Observation/Other Assessments   Focus on Therapeutic Outcomes (FOTO)  Knee - 45% (55% limitation)   AROM   Left Knee Extension 8   Left Knee Flexion 110  PROM   Left Knee Extension 0   Left Knee Flexion 115         Today's Treatment  TherEx NuStep L3 x 4' (emphasis on stretch into L knee flexion) Rec Bike - L1 (full revolutions)  x4' BATCA Leg Press 15# x15, 15# x10 Step Stretch for L Knee Flexion with foot on BATCA shoulder press seat at lowest height 10x5" Prone quad & RF stretches with strap 3x30' each Fitter seated leg press L knee ext (1 black/1 blue) x 15 reps  6" L Forward step-up with blue TB TKE x15  Vasopnuematic compression to L knee with lower leg elevated on bolster - medium compression, lowest temp x15'         PT Short Term Goals - 10/21/15 1601    PT SHORT TERM GOAL #1   Title Patient will be independent with initial HEP by 11/06/15   Status Achieved           PT Long Term Goals - 01/01/2016 1127    PT LONG TERM GOAL #1   Title Pt will be independent with final HEP by 12/20/15   Status On-going   PT LONG TERM GOAL #2    Title Pt will demonstrate left knee AROM 5-110 or greater by 12/20/15   Status On-going   PT LONG TERM GOAL #3   Title Pt will demonstrate left knee and hip strength 4-/5 or greater by 12/20/15   Status On-going   PT LONG TERM GOAL #4   Title Pt will ambulate with normal gait pattern without AD by 12/20/15   Status On-going   PT LONG TERM GOAL #5   Title Pt will report no limitation in daily mobility or household tasks due to left knee pain or weakness by 12/20/15   Status On-going           Plan - 2016/01/01 1124    Clinical Impression Statement Pt. reports swelling / pain relief following Ionto patch from last visit.  Pt. with improved L PROM 0-115 dg, AROM 8-110 dg in today's visit.  Pt. albe to progress to more active stepping activities in today's visit tolerating change well with only mild increase in L knee pain.  Pt. would benefit from further B hip / LE stretching / strengthening to improve activity tolerance.     PT Next Visit Plan L knee ROM assessment each visit.  Aggressive ROM; quad control exercises emphasizing TKE; hip/knee strengthening; review and update HEP PRN. Modalities PRN for pain & edema, including ionto patch (#2 of 6) if benefit noted.   Consulted and Agree with Plan of Care Patient          G-Codes - Jan 01, 2016 1250    Functional Assessment Tool Used Knee FOTO - 45% (55% limitation)   Functional Limitation Mobility: Walking and moving around   Mobility: Walking and Moving Around Current Status [id number]) At least 40 percent but less than 60 percent impaired, limited or restricted   Mobility: Walking and Moving Around Goal Status [phone]) At least 40 percent but less than 60 percent impaired, limited or restricted      Problem List Patient Active Problem List   Diagnosis Date Noted  . 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  [place], [place], [address] Phone: [phone]   Fax:  [phone]  Name: Maritta Mcneer MRN: [id number] Date of Birth:  1951/06/10   Percival Spanish, PT, MPT 12/02/2015, 12:53 PM  [facility] [address]  [place], [place], [address] Phone: [phone]   Fax:  [phone]

## 2015-12-02 NOTE — Therapy (Deleted)
[facility] [address]  [place], [place], [address] Phone: [phone]   Fax:  [phone]  Physical Therapy Treatment  Patient Details  Name: Elizabeth Singh MRN: [id number] Date of Birth: 1951/02/16 Referring Provider: [name], MD  Encounter Date: 12/02/2015    Past Medical History  Diagnosis Date  . PONV (postoperative nausea and vomiting)     once post surgery  . Hypertension   . High cholesterol   . Arthritis   . Joint pain   . GERD (gastroesophageal reflux disease)   . H/O hiatal hernia   . Thyroid disorder     benign goiter  . Glaucoma   . PVC (premature ventricular contraction)     hx of - 2002 potassium was low  . Hx: UTI (urinary tract infection)     2011 - frequent uti's (after stopping water aerobics the uti's stopped)  . Headache     hx of migraines (none in years)  . Fatty liver   . Diverticulosis     Past Surgical History  Procedure Laterality Date  . Cholecystectomy  1974  . Tubal ligation  1978  . Breast surgery  2002    left benign lumpectomy  . Knee arthroscopy  2001    right  . Cardiac catheterization  2002  . Total knee arthroplasty Right 01/23/2013    Procedure: TOTAL KNEE ARTHROPLASTY;  Surgeon: Vickey Huger, MD;  Location: [place];  Service: Orthopedics;  Laterality: Right;  . Colonoscopy    . Appendectomy    . Total knee arthroplasty Left 09/23/2015    Procedure: LEFT TOTAL KNEE ARTHROPLASTY;  Surgeon: Vickey Huger, MD;  Location: [place];  Service: Orthopedics;  Laterality: Left;    There were no vitals filed for this visit.  Visit Diagnosis:  No diagnosis found.      Subjective Assessment - 12/02/15 1023    Subjective Pt. reports 2/10 L knee pain.  Pt. to see MD tomorrow regarding L knee status.     Patient Stated Goals "To get the ROM back and the strength so I feel like I can control the knee when I walk."   Currently in Pain? Yes   Pain Score 2    Pain Location  Knee   Pain Orientation Left   Pain Descriptors / Indicators Sore;Tightness   Pain Type Surgical pain   Pain Radiating Towards n/a   Pain Onset 1 to 4 weeks ago   Pain Frequency Intermittent   Aggravating Factors  activity; bending; prolonged standing   Pain Relieving Factors ice; pain meds   Effect of Pain on Daily Activities limits with walking   Multiple Pain Sites No     Today's Treatment  TherEx NuStep L3 x 4' (emphasis on stretch into L knee flexion) Rec Bike - L1 (full revolutions)  x4' BATCA Leg Press 15# x15, 15# x10 Step Stretch for L Knee Flexion with foot on BATCA shoulder press seat at lowest height 10x5" Prone quad & RF stretches with strap 3x30' each Fitter seated leg press L knee ext (1 black/1 blue) x 15 reps  6" L Forward step-up with blue TB TKE x15  Vasopnuematic compression to L knee with lower leg elevated on bolster - medium compression, lowest temp x15'  Ionto patch (#1 of 6) to L pes anserine bursa - 1 ml Dexamethasone, 80 mA-min x 6 hr   L knee AROM: 11- 110 L knee PROM:  PT Short Term Goals - 10/21/15 1601    PT SHORT TERM GOAL #1   Title Patient will be independent with initial HEP by 11/06/15   Status Achieved           PT Long Term Goals - 11/26/15 1250    PT LONG TERM GOAL #1   Title Pt will be independent with final HEP by 12/20/15   Status On-going   PT LONG TERM GOAL #2   Title Pt will demonstrate left knee AROM 5-110 or greater by 12/20/15   Status On-going   PT LONG TERM GOAL #3   Title Pt will demonstrate left knee and hip strength 4-/5 or greater by 12/20/15   Status On-going   PT LONG TERM GOAL #4   Title Pt will ambulate with normal gait pattern without AD by 12/20/15   Status On-going   PT LONG TERM GOAL #5   Title Pt will report no limitation in daily mobility or household tasks due to left knee pain or weakness by 12/20/15   Status On-going               Problem  List Patient Active Problem List   Diagnosis Date Noted  . 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  [place], [place], [address] Phone: [phone]   Fax:  [phone]  Name: Elizabeth Singh MRN: [id number] Date of Birth: 12-16-1950

## 2015-12-04 ENCOUNTER — Ambulatory Visit: Payer: Medicare HMO

## 2015-12-04 DIAGNOSIS — M25562 Pain in left knee: Secondary | ICD-10-CM | POA: Diagnosis not present

## 2015-12-04 DIAGNOSIS — M25662 Stiffness of left knee, not elsewhere classified: Secondary | ICD-10-CM

## 2015-12-04 DIAGNOSIS — R29898 Other symptoms and signs involving the musculoskeletal system: Secondary | ICD-10-CM

## 2015-12-04 DIAGNOSIS — R269 Unspecified abnormalities of gait and mobility: Secondary | ICD-10-CM

## 2015-12-04 DIAGNOSIS — R262 Difficulty in walking, not elsewhere classified: Secondary | ICD-10-CM

## 2015-12-04 NOTE — Therapy (Signed)
[facility] [address]  [place], [place], [address] Phone: [phone]   Fax:  [phone]  Physical Therapy Treatment  Patient Details  Name: Elizabeth Singh MRN: [id number] Date of Birth: [birth year] Referring Provider: [name], MD  Encounter Date: 12/04/2015      PT End of Session - 12/04/15 1022    Visit Number 21   Number of Visits 26   Date for PT Re-Evaluation 12/20/15   PT Start Time 1017   PT Stop Time 1113   PT Time Calculation (min) 56 min   Activity Tolerance Patient tolerated treatment well   Behavior During Therapy [facility] for tasks assessed/performed      Past Medical History  Diagnosis Date  . PONV (postoperative nausea and vomiting)     once post surgery  . Hypertension   . High cholesterol   . Arthritis   . Joint pain   . GERD (gastroesophageal reflux disease)   . H/O hiatal hernia   . Thyroid disorder     benign goiter  . Glaucoma   . PVC (premature ventricular contraction)     hx of - 2002 potassium was low  . Hx: UTI (urinary tract infection)     2011 - frequent uti's (after stopping water aerobics the uti's stopped)  . Headache     hx of migraines (none in years)  . Fatty liver   . Diverticulosis     Past Surgical History  Procedure Laterality Date  . Cholecystectomy  1974  . Tubal ligation  1978  . Breast surgery  2002    left benign lumpectomy  . Knee arthroscopy  2001    right  . Cardiac catheterization  2002  . Total knee arthroplasty Right 01/23/2013    Procedure: TOTAL KNEE ARTHROPLASTY;  Surgeon: Vickey Huger, MD;  Location: [place];  Service: Orthopedics;  Laterality: Right;  . Colonoscopy    . Appendectomy    . Total knee arthroplasty Left 09/23/2015    Procedure: LEFT TOTAL KNEE ARTHROPLASTY;  Surgeon: Vickey Huger, MD;  Location: [place];  Service: Orthopedics;  Laterality: Left;    There were no vitals filed for this visit.  Visit Diagnosis:  Left knee  pain  Stiffness of left knee  Difficulty walking  Abnormality of gait  Weakness of left leg      Subjective Assessment - 12/04/15 1018    Subjective Pt. reports 2/10 L knee pain currently.  Pt. reports MD was pleased with L knee status and wants to finish PT this month.     Currently in Pain? Yes   Pain Score 2    Pain Location Knee   Pain Orientation Left   Pain Descriptors / Indicators Sore   Pain Type Surgical pain   Pain Onset More than a month ago   Pain Frequency Intermittent   Aggravating Factors  activity; bending; prolonged standing    Pain Relieving Factors ice; pain meds    Effect of Pain on Daily Activities limits with walking    Multiple Pain Sites No      Today's Treatment  TherEx NuStep L4 x 5' (emphasis on stretch into L knee flexion) Bridging x 10 reps HS curl with heels on peanut p-ball x 10 reps BATCA Leg Press 20# x15 Step Stretch for L Knee Flexion with foot on BATCA shoulder press seat at lowest height 10x5" Prone quad & RF stretches with strap 3x30' each Fitter seated leg press L  knee ext (1 black/1 blue) 2x10 TKE with black TB x15 6" L Forward step-up with blue TB TKE x15  Vasopnuematic compression to L knee with lower leg elevated on bolster - medium compression, lowest temp x15'  Ionto patch (#2 of 6) to L pes anserine bursa - 1 ml Dexamethasone, 80 mA-min x 6 hr        PT Short Term Goals - 10/21/15 1601    PT SHORT TERM GOAL #1   Title Patient will be independent with initial HEP by 11/06/15   Status Achieved           PT Long Term Goals - 12/02/15 1127    PT LONG TERM GOAL #1   Title Pt will be independent with final HEP by 12/20/15   Status On-going   PT LONG TERM GOAL #2   Title Pt will demonstrate left knee AROM 5-110 or greater by 12/20/15   Status On-going   PT LONG TERM GOAL #3   Title Pt will demonstrate left knee and hip strength 4-/5 or greater by 12/20/15   Status On-going   PT LONG TERM GOAL #4   Title Pt will  ambulate with normal gait pattern without AD by 12/20/15   Status On-going   PT LONG TERM GOAL #5   Title Pt will report no limitation in daily mobility or household tasks due to left knee pain or weakness by 12/20/15   Status On-going               Plan - 12/04/15 1023    Clinical Impression Statement Pt. tolerated treatment focused on stepping, TKE strengthening, and knee flexion ROM well with only mild pain increase at end range L knee flexion.  Pt. reports MD visit yesterday went well with MD being pleased with L knee status wanting to finish with PT this month.  Pt. L knee ROM and strength continues to progress steadily toward established goals.  Iontophoresis patch #2 applied to anteriomedial L knee to decrease swelling / pain    PT Next Visit Plan L knee ROM assessment each visit.  Aggressive ROM; quad control exercises emphasizing TKE; hip/knee strengthening; review and update HEP PRN. Modalities PRN for pain & edema, including ionto patch (#3 of 6) if benefit noted.        Problem List Patient Active Problem List   Diagnosis Date Noted  . 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  [place], [place], [address] Phone: [phone]   Fax:  [phone]  Name: Elizabeth Singh MRN: [id number] Date of Birth: 11-27-1950

## 2015-12-06 ENCOUNTER — Ambulatory Visit: Payer: Medicare HMO | Admitting: Physical Therapy

## 2015-12-06 DIAGNOSIS — R29898 Other symptoms and signs involving the musculoskeletal system: Secondary | ICD-10-CM

## 2015-12-06 DIAGNOSIS — M25662 Stiffness of left knee, not elsewhere classified: Secondary | ICD-10-CM

## 2015-12-06 DIAGNOSIS — R262 Difficulty in walking, not elsewhere classified: Secondary | ICD-10-CM

## 2015-12-06 DIAGNOSIS — M25562 Pain in left knee: Secondary | ICD-10-CM | POA: Diagnosis not present

## 2015-12-06 DIAGNOSIS — R269 Unspecified abnormalities of gait and mobility: Secondary | ICD-10-CM

## 2015-12-06 NOTE — Therapy (Signed)
[facility] [address]  [place], [place], [address] Phone: [phone]   Fax:  [phone]  Physical Therapy Treatment  Patient Details  Name: Elizabeth Singh MRN: [id number] Date of Birth: August 05, 1951 Referring Provider: [name], MD  Encounter Date: 12/06/2015      PT End of Session - 12/06/15 1025    Visit Number 22   Number of Visits 26   Date for PT Re-Evaluation 12/20/15   PT Start Time 1018   PT Stop Time 1112   PT Time Calculation (min) 54 min   Activity Tolerance Patient tolerated treatment well;Patient limited by fatigue   Behavior During Therapy [facility] for tasks assessed/performed      Past Medical History  Diagnosis Date  . PONV (postoperative nausea and vomiting)     once post surgery  . Hypertension   . High cholesterol   . Arthritis   . Joint pain   . GERD (gastroesophageal reflux disease)   . H/O hiatal hernia   . Thyroid disorder     benign goiter  . Glaucoma   . PVC (premature ventricular contraction)     hx of - 2002 potassium was low  . Hx: UTI (urinary tract infection)     2011 - frequent uti's (after stopping water aerobics the uti's stopped)  . Headache     hx of migraines (none in years)  . Fatty liver   . Diverticulosis     Past Surgical History  Procedure Laterality Date  . Cholecystectomy  1974  . Tubal ligation  1978  . Breast surgery  2002    left benign lumpectomy  . Knee arthroscopy  2001    right  . Cardiac catheterization  2002  . Total knee arthroplasty Right 01/23/2013    Procedure: TOTAL KNEE ARTHROPLASTY;  Surgeon: Vickey Huger, MD;  Location: [place];  Service: Orthopedics;  Laterality: Right;  . Colonoscopy    . Appendectomy    . Total knee arthroplasty Left 09/23/2015    Procedure: LEFT TOTAL KNEE ARTHROPLASTY;  Surgeon: Vickey Huger, MD;  Location: [place];  Service: Orthopedics;  Laterality: Left;    There were no vitals filed for this visit.  Visit  Diagnosis:  Left knee pain  Stiffness of left knee  Difficulty walking  Abnormality of gait  Weakness of left leg      Subjective Assessment - 12/06/15 1026    Subjective Pt reports her knee feels "rusted" (stiff) this morning and "just won't move". Reports she worked on her scar massage last night and states it feels a little irriated today. Pt planning to go to the [place] at [facility] to meet with a trainer today in prep for transition to gym program upon completion of PT at the end of the month.   Currently in Pain? Yes   Pain Score 2    Pain Location Knee   Pain Orientation Left            [facility] PT Assessment - 12/06/15 1018    ROM / Strength   AROM / PROM / Strength AROM   AROM   AROM Assessment Site Knee   Right/Left Knee Left   Left Knee Extension 10   Left Knee Flexion 112          Today's Treatment  TherEx Rec Bike - L1 (full revolutions) x5' R SL Bridge x 10  Bridge + Alternating Hip ABD/ER with blue TB x10 BATCA Leg  Press 20# x15 BATCA Knee Flexion 15# x15 B concentric/eccentric, 15# x10 B concentric/L eccentric  BATCA Knee Flexion 10# x10 B concentric/eccentric  TKE with black TB 2x10 8" L Forward step-up with black TB TKE x5 (stopped due to fatigue)  ROM check  Vasopnuematic compression to L knee with lower leg elevated on bolster - medium compression, lowest temp x10'          PT Short Term Goals - 10/21/15 1601    PT SHORT TERM GOAL #1   Title Patient will be independent with initial HEP by 11/06/15   Status Achieved           PT Long Term Goals - 12/02/15 1127    PT LONG TERM GOAL #1   Title Pt will be independent with final HEP by 12/20/15   Status On-going   PT LONG TERM GOAL #2   Title Pt will demonstrate left knee AROM 5-110 or greater by 12/20/15   Status On-going   PT LONG TERM GOAL #3   Title Pt will demonstrate left knee and hip strength 4-/5 or greater by 12/20/15   Status On-going   PT LONG TERM GOAL #4   Title  Pt will ambulate with normal gait pattern without AD by 12/20/15   Status On-going   PT LONG TERM GOAL #5   Title Pt will report no limitation in daily mobility or household tasks due to left knee pain or weakness by 12/20/15   Status On-going               Plan - 12/06/15 1212    Clinical Impression Statement Pt continues to self-limit and guard with left knee, favoring left leg with most activites and requiring cues for increased effort on left. Flexion ROM continues to slowly improve, but pt continues to lack 10 degrees TKE in [place].   PT Next Visit Plan L knee ROM assessment each visit.  Aggressive ROM; quad control exercises emphasizing TKE; hip/knee strengthening; review and update HEP PRN. Modalities PRN for pain & edema, including ionto patch (#3 of 6) PRN   Consulted and Agree with Plan of Care Patient        Problem List Patient Active Problem List   Diagnosis Date Noted  . 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

## 2015-12-09 ENCOUNTER — Ambulatory Visit: Payer: Medicare HMO

## 2015-12-09 DIAGNOSIS — R262 Difficulty in walking, not elsewhere classified: Secondary | ICD-10-CM

## 2015-12-09 DIAGNOSIS — M25562 Pain in left knee: Secondary | ICD-10-CM | POA: Diagnosis not present

## 2015-12-09 DIAGNOSIS — R29898 Other symptoms and signs involving the musculoskeletal system: Secondary | ICD-10-CM

## 2015-12-09 DIAGNOSIS — R269 Unspecified abnormalities of gait and mobility: Secondary | ICD-10-CM

## 2015-12-09 DIAGNOSIS — M25662 Stiffness of left knee, not elsewhere classified: Secondary | ICD-10-CM

## 2015-12-09 NOTE — Therapy (Signed)
[facility] [address]  [place], [place], [address] Phone: [phone]   Fax:  [phone]  Physical Therapy Treatment  Patient Details  Name: Elizabeth Singh MRN: [id number] Date of Birth: [birth year] Referring Provider: [name], MD  Encounter Date: 12/09/2015      PT End of Session - 12/09/15 1056    Visit Number 23   Number of Visits 26   Date for PT Re-Evaluation 12/20/15   PT Start Time 1021   PT Stop Time 1116   PT Time Calculation (min) 55 min   Activity Tolerance Patient tolerated treatment well;Patient limited by fatigue   Behavior During Therapy [facility] for tasks assessed/performed      Past Medical History  Diagnosis Date  . PONV (postoperative nausea and vomiting)     once post surgery  . Hypertension   . High cholesterol   . Arthritis   . Joint pain   . GERD (gastroesophageal reflux disease)   . H/O hiatal hernia   . Thyroid disorder     benign goiter  . Glaucoma   . PVC (premature ventricular contraction)     hx of - 2002 potassium was low  . Hx: UTI (urinary tract infection)     2011 - frequent uti's (after stopping water aerobics the uti's stopped)  . Headache     hx of migraines (none in years)  . Fatty liver   . Diverticulosis     Past Surgical History  Procedure Laterality Date  . Cholecystectomy  1974  . Tubal ligation  1978  . Breast surgery  2002    left benign lumpectomy  . Knee arthroscopy  2001    right  . Cardiac catheterization  2002  . Total knee arthroplasty Right 01/23/2013    Procedure: TOTAL KNEE ARTHROPLASTY;  Surgeon: Vickey Huger, MD;  Location: [place];  Service: Orthopedics;  Laterality: Right;  . Colonoscopy    . Appendectomy    . Total knee arthroplasty Left 09/23/2015    Procedure: LEFT TOTAL KNEE ARTHROPLASTY;  Surgeon: Vickey Huger, MD;  Location: [place];  Service: Orthopedics;  Laterality: Left;    There were no vitals filed for this visit.  Visit  Diagnosis:  Stiffness of left knee  Left knee pain  Difficulty walking  Abnormality of gait  Weakness of left leg      Subjective Assessment - 12/09/15 1026    Subjective 1/10 L knee pain today no other pain or complaints reported    Patient Stated Goals "To get the ROM back and the strength so I feel like I can control the knee when I walk."   Currently in Pain? Yes   Pain Score 1    Pain Location Knee   Pain Orientation Left   Pain Descriptors / Indicators Sore   Pain Type Surgical pain   Pain Radiating Towards n/a   Pain Onset More than a month ago   Pain Frequency Intermittent   Aggravating Factors  activity; bending; prolonged standing   Pain Relieving Factors ice; pain meds    Multiple Pain Sites No      Today's Treatment  TherEx Nustep x 5 min, level 4  Rec Bike - L1 (full revolutions) x5' Bridge with isometric / ER 3 x 5 reps Bridge with B hip ER x 10 reps Bridge + Alternating Hip ABD/ER with blue TB x10 L knee lunge stretch on BATCA seat x 10 x 5" BATCA Leg Press  20# x15 BATCA Knee Flexion 15# x15 B concentric/eccentric, 15# x10 B concentric/L eccentric  Dorsiflexion rocker stretch 2 x 30 sec TKE with black TB x 15  Vasopnuematic compression to L knee with lower leg elevated on bolster - medium compression, lowest temp x15'        PT Short Term Goals - 10/21/15 1601    PT SHORT TERM GOAL #1   Title Patient will be independent with initial HEP by 11/06/15   Status Achieved           PT Long Term Goals - 12/02/15 1127    PT LONG TERM GOAL #1   Title Pt will be independent with final HEP by 12/20/15   Status On-going   PT LONG TERM GOAL #2   Title Pt will demonstrate left knee AROM 5-110 or greater by 12/20/15   Status On-going   PT LONG TERM GOAL #3   Title Pt will demonstrate left knee and hip strength 4-/5 or greater by 12/20/15   Status On-going   PT LONG TERM GOAL #4   Title Pt will ambulate with normal gait pattern without AD by 12/20/15    Status On-going   PT LONG TERM GOAL #5   Title Pt will report no limitation in daily mobility or household tasks due to left knee pain or weakness by 12/20/15   Status On-going               Plan - 12/09/15 1057    Clinical Impression Statement Pt. tolerated all hip / knee strengthening well today with only mild L knee pain increased from initial 1/10 pain with knee flexion stretch.  Today's treatment focused on L knee quad / hamstring strengthening to improve AROM to narrow gap from PROM.     PT Next Visit Plan L knee ROM assessment each visit.  Aggressive ROM; quad control exercises emphasizing TKE; hip/knee strengthening; review and update HEP PRN. Modalities PRN for pain & edema, including ionto patch (#3 of 6) PRN   Consulted and Agree with Plan of Care Patient        Problem List Patient Active Problem List   Diagnosis Date Noted  . 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  [place], [place], [address] Phone: [phone]   Fax:  [phone]  Name: Elizabeth Singh MRN: [id number] Date of Birth: 07-28-1951

## 2015-12-12 ENCOUNTER — Ambulatory Visit: Payer: Medicare HMO

## 2015-12-12 DIAGNOSIS — M25562 Pain in left knee: Secondary | ICD-10-CM

## 2015-12-12 DIAGNOSIS — R269 Unspecified abnormalities of gait and mobility: Secondary | ICD-10-CM

## 2015-12-12 DIAGNOSIS — R262 Difficulty in walking, not elsewhere classified: Secondary | ICD-10-CM

## 2015-12-12 DIAGNOSIS — M25662 Stiffness of left knee, not elsewhere classified: Secondary | ICD-10-CM

## 2015-12-12 DIAGNOSIS — R29898 Other symptoms and signs involving the musculoskeletal system: Secondary | ICD-10-CM

## 2015-12-12 NOTE — Therapy (Signed)
[facility] [address]  [place], [place], [address] Phone: [phone]   Fax:  [phone]  Physical Therapy Treatment  Patient Details  Name: Elizabeth Singh MRN: [id number] Date of Birth: 02-Jan-1951 Referring Provider: [name], MD  Encounter Date: 12/12/2015      PT End of Session - 12/12/15 1049    Visit Number 24   Number of Visits 26   Date for PT Re-Evaluation 12/20/15   PT Start Time 1028   PT Stop Time 1117   PT Time Calculation (min) 49 min   Activity Tolerance Patient tolerated treatment well;Patient limited by fatigue   Behavior During Therapy [facility] for tasks assessed/performed      Past Medical History  Diagnosis Date  . PONV (postoperative nausea and vomiting)     once post surgery  . Hypertension   . High cholesterol   . Arthritis   . Joint pain   . GERD (gastroesophageal reflux disease)   . H/O hiatal hernia   . Thyroid disorder     benign goiter  . Glaucoma   . PVC (premature ventricular contraction)     hx of - 2002 potassium was low  . Hx: UTI (urinary tract infection)     2011 - frequent uti's (after stopping water aerobics the uti's stopped)  . Headache     hx of migraines (none in years)  . Fatty liver   . Diverticulosis     Past Surgical History  Procedure Laterality Date  . Cholecystectomy  1974  . Tubal ligation  1978  . Breast surgery  2002    left benign lumpectomy  . Knee arthroscopy  2001    right  . Cardiac catheterization  2002  . Total knee arthroplasty Right 01/23/2013    Procedure: TOTAL KNEE ARTHROPLASTY;  Surgeon: Vickey Huger, MD;  Location: [facility];  Service: Orthopedics;  Laterality: Right;  . Colonoscopy    . Appendectomy    . Total knee arthroplasty Left 09/23/2015    Procedure: LEFT TOTAL KNEE ARTHROPLASTY;  Surgeon: Vickey Huger, MD;  Location: [facility];  Service: Orthopedics;  Laterality: Left;    There were no vitals filed for this visit.  Visit  Diagnosis:  Stiffness of left knee  Left knee pain  Difficulty walking  Abnormality of gait  Weakness of left leg      Subjective Assessment - 12/12/15 1030    Subjective pt. reports 1/10 L knee pain today no other pain or complaints reported.     Patient Stated Goals "To get the ROM back and the strength so I feel like I can control the knee when I walk."   Currently in Pain? Yes   Pain Score 1    Pain Location Knee   Pain Orientation Left   Pain Descriptors / Indicators Sore   Pain Type Surgical pain   Pain Radiating Towards n/a   Pain Onset More than a month ago   Pain Frequency Intermittent   Aggravating Factors  activity        Today's Treatment  TherEx NuStep 3 min, level 5 L HS stretch x 30  Hooklying bridging x 10 reps Supine L knee flexion stretch with strap x 10 5"  Supine L quad sets into therapist knee with L LE elevated x 5" x 10 reps  BATCA Leg curl machine 20# x15 TKE with black TB x 15 reps  Seated leg press knee extension x 15 reps each  leg (1 blue / 1 black) Bridging with isometric / ER x 3 x 5 reps each   ROM check  Vasopnuematic compression to L knee with lower leg elevated on bolster - medium compression, lowest temp x10'     [facility] PT Assessment - 12/12/15 1043    AROM   Left Knee Extension 6   Left Knee Flexion 113            PT Short Term Goals - 10/21/15 1601    PT SHORT TERM GOAL #1   Title Patient will be independent with initial HEP by 11/06/15   Status Achieved           PT Long Term Goals - 12/02/15 1127    PT LONG TERM GOAL #1   Title Pt will be independent with final HEP by 12/20/15   Status On-going   PT LONG TERM GOAL #2   Title Pt will demonstrate left knee AROM 5-110 or greater by 12/20/15   Status On-going   PT LONG TERM GOAL #3   Title Pt will demonstrate left knee and hip strength 4-/5 or greater by 12/20/15   Status On-going   PT LONG TERM GOAL #4   Title Pt will ambulate with normal gait pattern without  AD by 12/20/15   Status On-going   PT LONG TERM GOAL #5   Title Pt will report no limitation in daily mobility or household tasks due to left knee pain or weakness by 12/20/15   Status On-going               Plan - 12/12/15 1152    Clinical Impression Statement Pt. tolerated all hip / knee strengthening activity well today at the L knee with pain increase only with end range L knee flexion.  Pt. with improved L knee AROM 6-113 dg today.  Pt. tolerated CKC L quad TKE well however still unable to tolerated OKC L knee extension activity on machine secondary to anterior knee pain.     PT Next Visit Plan L knee ROM assessment each visit.  Aggressive ROM; quad control exercises emphasizing TKE; hip/knee strengthening; review and update HEP PRN. Modalities PRN for pain & edema, including ionto patch (#3 of 6) PRN        Problem List Patient Active Problem List   Diagnosis Date Noted  . S/P total knee arthroplasty 09/23/2015    Bess Harvest, PTA  12/12/2015, 11:57 AM  [facility] [address]  [place], [place], [address] Phone: [phone]   Fax:  [phone]  Name: Elizabeth Singh MRN: [id number] Date of Birth: Jan 22, 1951

## 2015-12-16 ENCOUNTER — Ambulatory Visit: Payer: Medicare HMO | Admitting: Physical Therapy

## 2015-12-16 DIAGNOSIS — M25562 Pain in left knee: Secondary | ICD-10-CM | POA: Diagnosis not present

## 2015-12-16 DIAGNOSIS — R269 Unspecified abnormalities of gait and mobility: Secondary | ICD-10-CM

## 2015-12-16 DIAGNOSIS — M25662 Stiffness of left knee, not elsewhere classified: Secondary | ICD-10-CM

## 2015-12-16 DIAGNOSIS — R262 Difficulty in walking, not elsewhere classified: Secondary | ICD-10-CM

## 2015-12-16 DIAGNOSIS — R29898 Other symptoms and signs involving the musculoskeletal system: Secondary | ICD-10-CM

## 2015-12-16 NOTE — Therapy (Signed)
[facility] [address]  [place], [place], [address] Phone: [phone]   Fax:  [phone]  Physical Therapy Treatment  Patient Details  Name: Elizabeth Singh MRN: [id number] Date of Birth: July 02, 1951 Referring Provider: [name], MD  Encounter Date: 12/16/2015      PT End of Session - 12/16/15 1027    Visit Number 35   Number of Visits 26   Date for PT Re-Evaluation 12/20/15   PT Start Time 18  Pt arrived late   PT Stop Time 1120   PT Time Calculation (min) 60 min   Activity Tolerance Patient tolerated treatment well   Behavior During Therapy [facility] for tasks assessed/performed      Past Medical History  Diagnosis Date  . PONV (postoperative nausea and vomiting)     once post surgery  . Hypertension   . High cholesterol   . Arthritis   . Joint pain   . GERD (gastroesophageal reflux disease)   . H/O hiatal hernia   . Thyroid disorder     benign goiter  . Glaucoma   . PVC (premature ventricular contraction)     hx of - 2002 potassium was low  . Hx: UTI (urinary tract infection)     2011 - frequent uti's (after stopping water aerobics the uti's stopped)  . Headache     hx of migraines (none in years)  . Fatty liver   . Diverticulosis     Past Surgical History  Procedure Laterality Date  . Cholecystectomy  1974  . Tubal ligation  1978  . Breast surgery  2002    left benign lumpectomy  . Knee arthroscopy  2001    right  . Cardiac catheterization  2002  . Total knee arthroplasty Right 01/23/2013    Procedure: TOTAL KNEE ARTHROPLASTY;  Surgeon: Vickey Huger, MD;  Location: [facility];  Service: Orthopedics;  Laterality: Right;  . Colonoscopy    . Appendectomy    . Total knee arthroplasty Left 09/23/2015    Procedure: LEFT TOTAL KNEE ARTHROPLASTY;  Surgeon: Vickey Huger, MD;  Location: [facility];  Service: Orthopedics;  Laterality: Left;    There were no vitals filed for this visit.  Visit Diagnosis:   Stiffness of left knee  Left knee pain  Difficulty walking  Abnormality of gait  Weakness of left leg      Subjective Assessment - 12/16/15 1025    Subjective Pt reports increased stiffness in knee this morning after extended time sitting and riding in the car over the weekend.   Currently in Pain? Yes   Pain Score --  1-2/10   Pain Location Knee   Pain Orientation Left   Pain Descriptors / Indicators --  Stiffness            [facility] PT Assessment - 12/16/15 1020    Assessment   Next MD Visit ~ 01/28/16   AROM   AROM Assessment Site Knee   Right/Left Knee Left   Left Knee Extension 6   Left Knee Flexion 118   PROM   PROM Assessment Site Knee   Right/Left Knee Left   Left Knee Extension 0   Left Knee Flexion 120         Today's Treatment  TherEx Rec Bike - lvl 1 x 5\' 8"  L Forward step-up with black TB TKE x10 TM Hamstring pull x10 TM TKE attempted but pt unable to initiate movement TKE with black TB 2x10  TRX squat with TKE emphasis on return to stand x10 TRX squat with TKE + Heel raises on return to stand x10 Manual L HS stretch 2x30" Manual Prone Quad stretch with C/R 2x30" Prone Quad stretch with strap 2x30"  ROM check  Vasopnuematic compression to L knee with lower leg elevated on bolster - medium compression, lowest temp x15'          PT Short Term Goals - 10/21/15 1601    PT SHORT TERM GOAL #1   Title Patient will be independent with initial HEP by 11/06/15   Status Achieved           PT Long Term Goals - 12/16/15 1230    PT LONG TERM GOAL #1   Title Pt will be independent with final HEP by 12/20/15   Status On-going   PT LONG TERM GOAL #2   Title Pt will demonstrate left knee AROM 5-110 or greater by 12/20/15   Status Achieved   PT LONG TERM GOAL #3   Title Pt will demonstrate left knee and hip strength 4-/5 or greater by 12/20/15   Status On-going   PT LONG TERM GOAL #4   Title Pt will ambulate with normal gait pattern without AD  by 12/20/15   Status On-going   PT LONG TERM GOAL #5   Title Pt will report no limitation in daily mobility or household tasks due to left knee pain or weakness by 12/20/15   Status On-going               Plan - 12/16/15 1231    Clinical Impression Statement Pt reporting decreasing sensation of knee buckling during gait with imporving TKE noted. L knee ROM improved with 0-120 PROM and 6-116 AROM demonstrated today. Pt stating she would still like to continue with plan to transitiion to medical fitness program at [facility] as of the end of the month, therefore will review/update HEP as appropriate on next visit.   PT Next Visit Plan Review/update HEP for transition to medical fitness program at [facility]; Discharge assessment   Consulted and Agree with Plan of Care Patient        Problem List Patient Active Problem List   Diagnosis Date Noted  . 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

## 2015-12-19 ENCOUNTER — Ambulatory Visit: Payer: Medicare HMO | Admitting: Physical Therapy

## 2015-12-19 DIAGNOSIS — R262 Difficulty in walking, not elsewhere classified: Secondary | ICD-10-CM

## 2015-12-19 DIAGNOSIS — M25662 Stiffness of left knee, not elsewhere classified: Secondary | ICD-10-CM

## 2015-12-19 DIAGNOSIS — M25562 Pain in left knee: Secondary | ICD-10-CM

## 2015-12-19 DIAGNOSIS — R269 Unspecified abnormalities of gait and mobility: Secondary | ICD-10-CM

## 2015-12-19 DIAGNOSIS — M6281 Muscle weakness (generalized): Secondary | ICD-10-CM

## 2015-12-19 NOTE — Therapy (Signed)
[facility] [address]  [address] [place], [place], [address] Phone: [phone]   Fax:  [phone]  Physical Therapy Treatment  Patient Details  Name: Elizabeth Singh MRN: [id number] Date of Birth: 1951-05-22 Referring Provider: Georgena Spurling, MD  Encounter Date: 12/19/2015      PT End of Session - 12/19/15 1112    Visit Number 26   Number of Visits 26   Date for PT Re-Evaluation 12/20/15   PT Start Time 1108  Pt arrived late   PT Stop Time 1208   PT Time Calculation (min) 60 min   Activity Tolerance Patient tolerated treatment well   Behavior During Therapy [facility] for tasks assessed/performed      Past Medical History  Diagnosis Date  . PONV (postoperative nausea and vomiting)     once post surgery  . Hypertension   . High cholesterol   . Arthritis   . Joint pain   . GERD (gastroesophageal reflux disease)   . H/O hiatal hernia   . Thyroid disorder     benign goiter  . Glaucoma   . PVC (premature ventricular contraction)     hx of - 2002 potassium was low  . Hx: UTI (urinary tract infection)     2011 - frequent uti's (after stopping water aerobics the uti's stopped)  . Headache     hx of migraines (none in years)  . Fatty liver   . Diverticulosis     Past Surgical History  Procedure Laterality Date  . Cholecystectomy  1974  . Tubal ligation  1978  . Breast surgery  2002    left benign lumpectomy  . Knee arthroscopy  2001    right  . Cardiac catheterization  2002  . Total knee arthroplasty Right 01/23/2013    Procedure: TOTAL KNEE ARTHROPLASTY;  Surgeon: Dannielle Huh, MD;  [facility];  Service: Orthopedics;  Laterality: Right;  . Colonoscopy    . Appendectomy    . Total knee arthroplasty Left 09/23/2015    Procedure: LEFT TOTAL KNEE ARTHROPLASTY;  Surgeon: Dannielle Huh, MD;  [facility];  Service: Orthopedics;  Laterality: Left;    There were no vitals filed for this visit.  Visit Diagnosis:   Stiffness of left knee  Left knee pain  Difficulty walking  Abnormality of gait  Muscle weakness (generalized)      Subjective Assessment - 12/19/15 1110    Subjective Pt reports she was over-zealous yesterday, cleaning out bedroom closets while on her feet much of the day, and feels more stiff today.   Currently in Pain? Yes   Pain Score --  2-3/10   Pain Location Knee   Pain Orientation Left   Pain Descriptors / Indicators --  Stiffness            [facility] PT Assessment - 12/19/15 1108    Assessment   Medical Diagnosis Left TKR   Next MD Visit 02/04/16   Home Environment   Living Environment Private residence   Living Arrangements Alone   Type of Home House   Home Access Level entry   Home Layout One level   Prior Function   Level of Independence Independent   Vocation Retired   Careers information officer cards, activities with friends, dance, gym 3-4x/wk, recumbent bike 5 mi/day during the week   ROM / Strength   AROM / PROM / Strength AROM;PROM;Strength   AROM   AROM Assessment Site Knee   Right/Left Knee  Left   Left Knee Extension 4   Left Knee Flexion 111   PROM   PROM Assessment Site Knee   Right/Left Knee Left   Left Knee Extension 0   Left Knee Flexion 114   Strength   Strength Assessment Site Hip;Knee   Right/Left Hip Left   Left Hip Flexion 4/5   Left Hip Extension 4-/5   Left Hip ABduction 4/5   Left Hip ADduction 4/5   Right/Left Knee Left   Left Knee Flexion 4+/5   Left Knee Extension 4-/5   Ambulation/Gait   Assistive device Straight cane;None   Gait Pattern Within Functional Limits   Ambulation Surface Level;Indoor   Stairs Yes   Stairs Assistance 5: Supervision   Stairs Assistance Details (indicate cue type and reason) Cues to avoid hip rotation and hip drop, focusing on eccentric control at knee.   Stair Management Technique Alternating pattern;One rail Right   Number of Stairs 12  x2   Height of Stairs 7   Gait Comments Pt demonstrating  exaggerated effort to lift body weight when leading with L foot on ascent and compensating with hip rotation and pelvic drop when eccentricly lowering with L LE on descent.        Today's Treatment  ROM & MMT check Gait & stair assessment (see above) Goal Assessment Recert   TherEx L Single leg bridge x10 Bridge + Alteranating Knee extension x10 Bridge + Alternating Hip ABD/ER with blue TB x10 TKE with black TB x10 Step stretch (reviewed but not attempted) 6" L Fwd Step-up x10 6" L Lat Step-up x10 4" L Eccentric step-down with toe touch x10 (repeated verbal and tactile cues to maintain level pelvis and lower by flexing knee)         PT Education - 12/19/15 1252    Education provided Yes   Education Details Updated HEP and instruction in appropriate activities to be completed at [facility]) Educated Patient   Methods Explanation;Demonstration;Handout;Tactile cues;Verbal cues   Comprehension Verbalized understanding;Returned demonstration;Verbal cues required;Tactile cues required;Need further instruction          PT Short Term Goals - 10/21/15 1601    PT SHORT TERM GOAL #1   Title Patient will be independent with initial HEP by 11/06/15   Status Achieved           PT Long Term Goals - 12/19/15 1124    PT LONG TERM GOAL #1   Title Pt will be independent with final HEP by 01/17/16   Status On-going   PT LONG TERM GOAL #2   Title Pt will demonstrate left knee AROM 5-110 or greater by 12/20/15   Status Achieved   PT LONG TERM GOAL #3   Title Pt will demonstrate left knee and hip strength 4-/5 or greater by 12/20/15   Status Achieved   PT LONG TERM GOAL #4   Title Pt will ambulate with normal gait pattern without AD by 01/17/16   Status Partially Met  Pt able to ambulate with normal pattern without AD on level surfaces indoors but continues to rely on [facility] when going out and on uneven surfaces.   PT LONG TERM GOAL #5   Title Pt will report no  limitation in daily mobility or household tasks due to left knee pain or weakness by 01/17/16   Status On-going  Pt continues to note "irritability" in R knee with increased activity within home requiring slower pace and continued rest breaks throughout the  day.   Additional Long Term Goals   Additional Long Term Goals Yes   PT LONG TERM GOAL #6   Title Pt will demonstrate left knee AROM 3-115 or greater by 01/17/16   Status New   PT LONG TERM GOAL #7   Title Pt will demonstrate left knee and hip strength 4/5 or greater by 01/17/16   Status New   PT LONG TERM GOAL #8   Title Pt will ascend/descend stairs reciprocally with normal step pattern by 01/17/16   Status New               Plan - 12/19/15 1235    Clinical Impression Statement Progress with PT has been slower than initially anticipated due to patient's self-limiting behavior, pain avoidance and plateauing of ROM progression requiring MUA of L knee. At present pt demonstrates L knee AROM 4-111 and PROM 0-114 but measurements fluctuate regularly due to variable pain and edema. L LE strength has improved significantly from eval but MMT continues to reveal weakness in L hip flexion and extension along with knee extension at 4-/5 which presents functionally with continued need for [facility] with most ambulation and difficulty with stair/curb negotiation both on ascent and descent. Given continued functional deficits, recommend continued PT with reduced frequency to 1x/wk x 4 weeks as pt begins transition to medical fitness program at the [facility].   Pt will benefit from skilled therapeutic intervention in order to improve on the following deficits Pain;Decreased range of motion;Decreased strength;Impaired flexibility;Difficulty walking;Abnormal gait;Decreased activity tolerance;Decreased balance;Impaired perceived functional ability;Increased edema   Rehab Potential Good   PT Frequency 1x / week   PT Duration 4 weeks   PT  Treatment/Interventions Therapeutic exercise;Manual techniques;Passive range of motion;Scar mobilization;Therapeutic activities;Gait training;Stair training;Electrical Stimulation;Cryotherapy;Vasopneumatic Device;Taping;Iontophoresis '4mg'$ /ml Dexamethasone;Balance training;Neuromuscular re-education;ADLs/Self Care Home Management;Patient/family education   PT Next Visit Plan Focus on functional strenghtening and quad control for gait w/o AD and stair negotiation   Consulted and Agree with Plan of Care Patient        Problem List Patient Active Problem List   Diagnosis Date Noted  . 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  [place], [place], [address] Phone: [phone]   Fax:  [phone]  Name: Elizabeth Singh MRN: [id number] Date of Birth: January 04, 1951

## 2015-12-26 ENCOUNTER — Ambulatory Visit: Payer: Medicare HMO | Attending: Orthopedic Surgery | Admitting: Physical Therapy

## 2015-12-26 DIAGNOSIS — M25562 Pain in left knee: Secondary | ICD-10-CM

## 2015-12-26 DIAGNOSIS — M6281 Muscle weakness (generalized): Secondary | ICD-10-CM

## 2015-12-26 DIAGNOSIS — M25662 Stiffness of left knee, not elsewhere classified: Secondary | ICD-10-CM | POA: Diagnosis not present

## 2015-12-26 DIAGNOSIS — R269 Unspecified abnormalities of gait and mobility: Secondary | ICD-10-CM | POA: Diagnosis present

## 2015-12-26 DIAGNOSIS — R2689 Other abnormalities of gait and mobility: Secondary | ICD-10-CM | POA: Insufficient documentation

## 2015-12-26 DIAGNOSIS — R262 Difficulty in walking, not elsewhere classified: Secondary | ICD-10-CM

## 2015-12-26 NOTE — Therapy (Signed)
[facility] [address]  [address] [place], [place], [address] Phone: [phone]   Fax:  [phone]  Physical Therapy Treatment  Patient Details  Name: Elizabeth Singh MRN: [id number] Date of Birth: 1951/04/09 Referring Provider: Georgena Spurling, MD  Encounter Date: 12/26/2015      PT End of Session - 12/26/15 1113    Visit Number 27   Number of Visits 30   Date for PT Re-Evaluation 01/17/16   PT Start Time 1102   PT Stop Time 1157   PT Time Calculation (min) 55 min   Activity Tolerance Patient tolerated treatment well   Behavior During Therapy [facility] for tasks assessed/performed      Past Medical History  Diagnosis Date  . PONV (postoperative nausea and vomiting)     once post surgery  . Hypertension   . High cholesterol   . Arthritis   . Joint pain   . GERD (gastroesophageal reflux disease)   . H/O hiatal hernia   . Thyroid disorder     benign goiter  . Glaucoma   . PVC (premature ventricular contraction)     hx of - 2002 potassium was low  . Hx: UTI (urinary tract infection)     2011 - frequent uti's (after stopping water aerobics the uti's stopped)  . Headache     hx of migraines (none in years)  . Fatty liver   . Diverticulosis     Past Surgical History  Procedure Laterality Date  . Cholecystectomy  1974  . Tubal ligation  1978  . Breast surgery  2002    left benign lumpectomy  . Knee arthroscopy  2001    right  . Cardiac catheterization  2002  . Total knee arthroplasty Right 01/23/2013    Procedure: TOTAL KNEE ARTHROPLASTY;  Surgeon: Dannielle Huh, MD;  [facility];  Service: Orthopedics;  Laterality: Right;  . Colonoscopy    . Appendectomy    . Total knee arthroplasty Left 09/23/2015    Procedure: LEFT TOTAL KNEE ARTHROPLASTY;  Surgeon: Dannielle Huh, MD;  [facility];  Service: Orthopedics;  Laterality: Left;    There were no vitals filed for this visit.  Visit Diagnosis:  Stiffness of left  knee  Left knee pain  Difficulty walking  Abnormality of gait  Muscle weakness (generalized)      Subjective Assessment - 12/26/15 1109    Subjective Pt states that she has started at the [facility] 2x/wk this week and "they work me hard too".  Will only be going 1x next week due to holiday and therapy schedule.   Patient Stated Goals "To get the ROM back and the strength so I feel like I can control the knee when I walk."   Currently in Pain? Yes   Pain Score 2    Pain Location Knee   Pain Orientation Left   Pain Descriptors / Indicators --  "Stiff & sticky"          Today's Treatment  TherEx Rec Bike - lvl 2 x 5' L Fwd Step-up 6" x10 L Lat Step-up 4" x10, 6" x10 L 6" Fwd Step-up + TKE with black TB x12 4" L Eccentric step-down with toe touch x10 (repeated verbal and tactile cues along with visual cues from mirror to maintain level pelvis and lower by flexing knee) Fitter B Hip Abduction & Extension (2 blue) x12 each  Vasopneumatic compression to L knee with leg elevated on bolster -  medium compression, lowest temp x 15'         PT Short Term Goals - 10/21/15 1601    PT SHORT TERM GOAL #1   Title Patient will be independent with initial HEP by 11/06/15   Status Achieved           PT Long Term Goals - 12/26/15 1150    PT LONG TERM GOAL #1   Title Pt will be independent with final HEP by 01/17/16   Status On-going   PT LONG TERM GOAL #4   Title Pt will ambulate with normal gait pattern without AD by 01/17/16   Status Partially Met   PT LONG TERM GOAL #5   Title Pt will report no limitation in daily mobility or household tasks due to left knee pain or weakness by 01/17/16   Status On-going   PT LONG TERM GOAL #6   Title Pt will demonstrate left knee AROM 3-115 or greater by 01/17/16   Status On-going   PT LONG TERM GOAL #7   Title Pt will demonstrate left knee and hip strength 4/5 or greater by 01/17/16   Status On-going   PT LONG TERM GOAL #8    Title Pt will ascend/descend stairs reciprocally with normal step pattern by 01/17/16   Status On-going               Plan - 12/26/15 1151    Clinical Impression Statement Pt started working out with [facility] and activities described well compliment therapy activities w/o overlapping. Continued focus on isolation of quad activation and avoidance of substitution from hip during step-up and step-down activities and progressed hip strengthening with standing Fitter exercises. Pt continues to require close monitoring for proper technique to avoid substitution.   PT Next Visit Plan Focus on functional strenghtening and quad control for gait w/o AD and stair negotiation, ROM/MMT check   Consulted and Agree with Plan of Care Patient        Problem List Patient Active Problem List   Diagnosis Date Noted  . 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

## 2016-01-02 ENCOUNTER — Ambulatory Visit: Payer: Medicare HMO | Admitting: Physical Therapy

## 2016-01-02 DIAGNOSIS — R262 Difficulty in walking, not elsewhere classified: Secondary | ICD-10-CM

## 2016-01-02 DIAGNOSIS — M6281 Muscle weakness (generalized): Secondary | ICD-10-CM

## 2016-01-02 DIAGNOSIS — M25562 Pain in left knee: Secondary | ICD-10-CM

## 2016-01-02 DIAGNOSIS — M25662 Stiffness of left knee, not elsewhere classified: Secondary | ICD-10-CM | POA: Diagnosis not present

## 2016-01-02 DIAGNOSIS — R2689 Other abnormalities of gait and mobility: Secondary | ICD-10-CM

## 2016-01-02 NOTE — Therapy (Signed)
[facility] [address]  [place], [place], [address] Phone: [phone]   Fax:  [phone]  Physical Therapy Treatment  Patient Details  Name: Elizabeth Singh MRN: [id number] Date of Birth: 01/09/1951 Referring Provider: [name], MD  Encounter Date: 01/02/2016      PT End of Session - 01/02/16 1501    Visit Number 28   Number of Visits 30   Date for PT Re-Evaluation 01/17/16   PT Start Time 1450   PT Stop Time 1541   PT Time Calculation (min) 51 min   Activity Tolerance Patient tolerated treatment well   Behavior During Therapy [facility] for tasks assessed/performed      Past Medical History  Diagnosis Date  . PONV (postoperative nausea and vomiting)     once post surgery  . Hypertension   . High cholesterol   . Arthritis   . Joint pain   . GERD (gastroesophageal reflux disease)   . H/O hiatal hernia   . Thyroid disorder     benign goiter  . Glaucoma   . PVC (premature ventricular contraction)     hx of - 2002 potassium was low  . Hx: UTI (urinary tract infection)     2011 - frequent uti's (after stopping water aerobics the uti's stopped)  . Headache     hx of migraines (none in years)  . Fatty liver   . Diverticulosis     Past Surgical History  Procedure Laterality Date  . Cholecystectomy  1974  . Tubal ligation  1978  . Breast surgery  2002    left benign lumpectomy  . Knee arthroscopy  2001    right  . Cardiac catheterization  2002  . Total knee arthroplasty Right 01/23/2013    Procedure: TOTAL KNEE ARTHROPLASTY;  Surgeon: Vickey Huger, MD;  Location: [facility];  Service: Orthopedics;  Laterality: Right;  . Colonoscopy    . Appendectomy    . Total knee arthroplasty Left 09/23/2015    Procedure: LEFT TOTAL KNEE ARTHROPLASTY;  Surgeon: Vickey Huger, MD;  Location: [facility];  Service: Orthopedics;  Laterality: Left;    There were no vitals filed for this visit.      Subjective Assessment -  01/02/16 1455    Subjective Pt has not been to the [facility] this week because her knee has been more swollen and painful this week. Pain has woken her up 2x from sleep this week. Called MD regarding these concerns and was seen in the office yesterday. MD told her to cut back on the strengthening and focus on flexibility and ROM. Also MD has her taking buffered aspirin to reduce inflammation and she has been elevating leg on 2 pillows.   Patient Stated Goals "To get the ROM back and the strength so I feel like I can control the knee when I walk."   Currently in Pain? Yes   Pain Score 2    Pain Location Knee   Pain Orientation Left   Pain Descriptors / Indicators --  "Sticky"            [facility] PT Assessment - 01/02/16 1450    Assessment   Medical Diagnosis Left TKR   Next MD Visit 02/04/16   Prior Function   Level of Independence Independent   Vocation Retired   [employer], activities with friends, dance, gym 3-4x/wk, recumbent bike 5 mi/day during the week   ROM / Strength   AROM /  PROM / Strength AROM;PROM   AROM   AROM Assessment Site Knee   Right/Left Knee Left   Left Knee Extension 4   Left Knee Flexion 116   PROM   PROM Assessment Site Knee   Right/Left Knee Left   Left Knee Extension 0   Left Knee Flexion 119          Today's Treatment  TherEx Rec Bike - lvl 2 x 7' Knee flexion/HS curl on peanut ball x20 L SAQ + SLR x15 Step stretch  Manual  L patellar mobs - all directions Retrograde massage from L ankle to above L knee  ROM check  Vasopneumatic compression to L knee with leg elevated on bolster - medium compression, lowest temp x 15'          PT Short Term Goals - 10/21/15 1601    PT SHORT TERM GOAL #1   Title Patient will be independent with initial HEP by 11/06/15   Status Achieved           PT Long Term Goals - 01/02/16 1642    PT LONG TERM GOAL #1   Title Pt will be independent with final HEP by 01/17/16   Status  On-going   PT LONG TERM GOAL #4   Title Pt will ambulate with normal gait pattern without AD by 01/17/16   Status Partially Met   PT LONG TERM GOAL #5   Title Pt will report no limitation in daily mobility or household tasks due to left knee pain or weakness by 01/17/16   Status On-going   PT LONG TERM GOAL #6   Title Pt will demonstrate left knee AROM 3-115 or greater by 01/17/16   Status Partially Met  Met for flexion ROM: 4-116 AROM, 0-119 PROM   PT LONG TERM GOAL #7   Title Pt will demonstrate left knee and hip strength 4/5 or greater by 01/17/16   Status On-going   PT LONG TERM GOAL #8   Title Pt will ascend/descend stairs reciprocally with normal step pattern by 01/17/16   Status On-going               Plan - 01/02/16 1647    Clinical Impression Statement Pt reporting continued concerns regarding swelling in knee and returned to MD for unplanned visit yesterday. States MD instructed her to not worry about strengthening and to to focus on ROM and flexibility as well as try buffered aspirin as an antiinflammatory (unable to take ibuprofen or naproxen due to stomach upset). PT also suggested use of thigh-high TED hose (pt already owns and used after 1st TKR) to aid in distal LE and knee edema reduction along with continued elevation and icing.   PT Treatment/Interventions Therapeutic exercise;Manual techniques;Passive range of motion;Scar mobilization;Therapeutic activities;Gait training;Stair training;Electrical Stimulation;Cryotherapy;Vasopneumatic Device;Taping;Iontophoresis '4mg'$ /ml Dexamethasone;Balance training;Neuromuscular re-education;ADLs/Self Care Home Management;Patient/family education   PT Next Visit Plan Focus on ROM/flexibility and basic functional strenghtening and quad control for gait and stair negotiation   Consulted and Agree with Plan of Care Patient      Patient will benefit from skilled therapeutic intervention in order to improve the following deficits and  impairments:  Pain, Decreased range of motion, Decreased strength, Impaired flexibility, Difficulty walking, Abnormal gait, Decreased activity tolerance, Decreased balance, Impaired perceived functional ability, Increased edema  Visit Diagnosis: Pain in left knee  Stiffness of left knee, not elsewhere classified  Difficulty in walking, not elsewhere classified  Other abnormalities of gait and mobility  Muscle weakness (generalized)  Problem List Patient Active Problem List   Diagnosis Date Noted  . 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

## 2016-01-10 ENCOUNTER — Ambulatory Visit: Payer: Medicare HMO | Admitting: Physical Therapy

## 2016-01-10 DIAGNOSIS — M25662 Stiffness of left knee, not elsewhere classified: Secondary | ICD-10-CM | POA: Diagnosis not present

## 2016-01-10 DIAGNOSIS — R2689 Other abnormalities of gait and mobility: Secondary | ICD-10-CM

## 2016-01-10 DIAGNOSIS — M25562 Pain in left knee: Secondary | ICD-10-CM

## 2016-01-10 DIAGNOSIS — R262 Difficulty in walking, not elsewhere classified: Secondary | ICD-10-CM

## 2016-01-10 DIAGNOSIS — M6281 Muscle weakness (generalized): Secondary | ICD-10-CM

## 2016-01-10 NOTE — Therapy (Signed)
[facility] [address]  [place], [place], [address] Phone: [phone]   Fax:  [phone]  Physical Therapy Treatment  Patient Details  Name: Elizabeth Singh MRN: [id number] Date of Birth: 07/24/1951 Referring Provider: [name], MD  Encounter Date: 01/10/2016      PT End of Session - 01/10/16 1114    Visit Number 29   Number of Visits 30   Date for PT Re-Evaluation 01/17/16   PT Start Time 1103   PT Stop Time 1157   PT Time Calculation (min) 54 min   Activity Tolerance Patient tolerated treatment well   Behavior During Therapy [facility] for tasks assessed/performed      Past Medical History  Diagnosis Date  . PONV (postoperative nausea and vomiting)     once post surgery  . Hypertension   . High cholesterol   . Arthritis   . Joint pain   . GERD (gastroesophageal reflux disease)   . H/O hiatal hernia   . Thyroid disorder     benign goiter  . Glaucoma   . PVC (premature ventricular contraction)     hx of - 2002 potassium was low  . Hx: UTI (urinary tract infection)     2011 - frequent uti's (after stopping water aerobics the uti's stopped)  . Headache     hx of migraines (none in years)  . Fatty liver   . Diverticulosis     Past Surgical History  Procedure Laterality Date  . Cholecystectomy  1974  . Tubal ligation  1978  . Breast surgery  2002    left benign lumpectomy  . Knee arthroscopy  2001    right  . Cardiac catheterization  2002  . Total knee arthroplasty Right 01/23/2013    Procedure: TOTAL KNEE ARTHROPLASTY;  Surgeon: Vickey Huger, MD;  Location: [facility];  Service: Orthopedics;  Laterality: Right;  . Colonoscopy    . Appendectomy    . Total knee arthroplasty Left 09/23/2015    Procedure: LEFT TOTAL KNEE ARTHROPLASTY;  Surgeon: Vickey Huger, MD;  Location: [facility];  Service: Orthopedics;  Laterality: Left;    There were no vitals filed for this visit.      Subjective Assessment -  01/10/16 1107    Subjective Pt reports she has been increasing her self-massage for the swelling to 2x/day and thinks it's helping some. Pain has been "about the same".   Patient Stated Goals "To get the ROM back and the strength so I feel like I can control the knee when I walk."   Currently in Pain? Yes   Pain Score --  1-2/10   Pain Location Knee   Pain Orientation Left            [facility] PT Assessment - 01/10/16 1103    ROM / Strength   AROM / PROM / Strength AROM   AROM   AROM Assessment Site Knee   Right/Left Knee Left   Left Knee Extension 3   Left Knee Flexion 117   PROM   PROM Assessment Site Knee   Right/Left Knee Left   Left Knee Extension 0   Left Knee Flexion 120           Today's Treatment  TherEx Rec Bike - interval lvl 3-4 x 6'  ROM check  Manual  L patellar mobs - all directions with emphasis on medial & inferior glides Retrograde massage from L ankle to above L knee Supine  hamstring stretch  TherEx Prone Quad stretch with strap 3x30" Prone RF stretch with strap + knee elevated on rolled pillow for increased hip extension 3x30"  Gait Gait assessment Stair training - 2x12 steps with cues to maintain level hips allow knee to bend for eccentric lowering  TherEx 6" Fwd Step-up & over leading with L LE x10 6" B Lat Step-up & over leading with L LE x10 8" Fwd Step-up & over leading with L LE x10, HHA required for higher step  Vasopneumatic compression to L knee with leg elevated on bolster - medium compression, lowest temp x 15'          PT Short Term Goals - 10/21/15 1601    PT SHORT TERM GOAL #1   Title Patient will be independent with initial HEP by 11/06/15   Status Achieved           PT Long Term Goals - 01/10/16 1149    PT LONG TERM GOAL #1   Title Pt will be independent with final HEP by 01/17/16   Status Achieved   PT LONG TERM GOAL #4   Title Pt will ambulate with normal gait pattern without AD by 01/17/16   Status  Achieved   PT LONG TERM GOAL #5   Title Pt will report no limitation in daily mobility or household tasks due to left knee pain or weakness by 01/17/16   Status Achieved   PT LONG TERM GOAL #6   Title Pt will demonstrate left knee AROM 3-115 or greater by 01/17/16   Status Achieved  3-117 AROM, 0-120 PROM   PT LONG TERM GOAL #7   Title Pt will demonstrate left knee and hip strength 4/5 or greater by 01/17/16   Status On-going   PT LONG TERM GOAL #8   Title Pt will ascend/descend stairs reciprocally with normal step pattern by 01/17/16   Status Partially Met  Pt able to ascend reciprocally with good pattern, but still demonstrating excessive hip drop on descent w/o cues               Plan - 01/10/16 1154    Clinical Impression Statement Pt continues to c/o "stickiness" when attempting to bend L knee but ROM improved to 3-117 AROM and 0-120 PROM. Pt reports no issues since weaning off SPC other than stepping down from curb on a wet day. Pt able to ambulate with good pattern w/o AD and ascend stairs with good reciprocal pattern but continues to rely on hip drop when descending unless cued otherwise. Focused on stait and step training maintaining level hips and encouraged pt to continue to practice at home. Majority of LTG's now met other than issues identified with stairs and strength not testesd today. Anticipate pt should be ready for discahrge as planned at next visit.   PT Treatment/Interventions Therapeutic exercise;Manual techniques;Passive range of motion;Scar mobilization;Therapeutic activities;Gait training;Stair training;Electrical Stimulation;Cryotherapy;Vasopneumatic Device;Taping;Iontophoresis 45m/ml Dexamethasone;Balance training;Neuromuscular re-education;ADLs/Self Care Home Management;Patient/family education   PT Next Visit Plan Discharge assessment   Consulted and Agree with Plan of Care Patient      Patient will benefit from skilled therapeutic intervention in order to  improve the following deficits and impairments:  Pain, Decreased range of motion, Decreased strength, Impaired flexibility, Difficulty walking, Abnormal gait, Decreased activity tolerance, Decreased balance, Impaired perceived functional ability, Increased edema  Visit Diagnosis: Pain in left knee  Stiffness of left knee, not elsewhere classified  Difficulty in walking, not elsewhere classified  Other abnormalities of gait and  mobility  Muscle weakness (generalized)     Problem List Patient Active Problem List   Diagnosis Date Noted  . 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

## 2016-01-17 ENCOUNTER — Ambulatory Visit: Payer: Medicare HMO | Admitting: Physical Therapy

## 2016-01-17 DIAGNOSIS — M6281 Muscle weakness (generalized): Secondary | ICD-10-CM

## 2016-01-17 DIAGNOSIS — M25562 Pain in left knee: Secondary | ICD-10-CM

## 2016-01-17 DIAGNOSIS — M25662 Stiffness of left knee, not elsewhere classified: Secondary | ICD-10-CM | POA: Diagnosis not present

## 2016-01-17 DIAGNOSIS — R2689 Other abnormalities of gait and mobility: Secondary | ICD-10-CM

## 2016-01-17 DIAGNOSIS — R262 Difficulty in walking, not elsewhere classified: Secondary | ICD-10-CM

## 2016-01-17 NOTE — Therapy (Signed)
[facility] [address]  [place], [place], [address] Phone: [phone]   Fax:  [phone]  Physical Therapy Treatment  Patient Details  Name: Elizabeth Singh MRN: [id number] Date of Birth: 16-Jun-1951 Referring Provider: [name], MD  Encounter Date: 01/17/2016      PT End of Session - 01/17/16 1110    Visit Number 30   Number of Visits 30   PT Start Time 5462  Pt arrived late   PT Stop Time 1144   PT Time Calculation (min) 39 min   Activity Tolerance Patient tolerated treatment well   Behavior During Therapy [facility] for tasks assessed/performed      Past Medical History  Diagnosis Date  . PONV (postoperative nausea and vomiting)     once post surgery  . Hypertension   . High cholesterol   . Arthritis   . Joint pain   . GERD (gastroesophageal reflux disease)   . H/O hiatal hernia   . Thyroid disorder     benign goiter  . Glaucoma   . PVC (premature ventricular contraction)     hx of - 2002 potassium was low  . Hx: UTI (urinary tract infection)     2011 - frequent uti's (after stopping water aerobics the uti's stopped)  . Headache     hx of migraines (none in years)  . Fatty liver   . Diverticulosis     Past Surgical History  Procedure Laterality Date  . Cholecystectomy  1974  . Tubal ligation  1978  . Breast surgery  2002    left benign lumpectomy  . Knee arthroscopy  2001    right  . Cardiac catheterization  2002  . Total knee arthroplasty Right 01/23/2013    Procedure: TOTAL KNEE ARTHROPLASTY;  Surgeon: Vickey Huger, MD;  Location: [facility];  Service: Orthopedics;  Laterality: Right;  . Colonoscopy    . Appendectomy    . Total knee arthroplasty Left 09/23/2015    Procedure: LEFT TOTAL KNEE ARTHROPLASTY;  Surgeon: Vickey Huger, MD;  Location: [facility];  Service: Orthopedics;  Laterality: Left;    There were no vitals filed for this visit.      Subjective Assessment - 01/17/16 1107    Subjective Pt reports she has been much more active this week, trying to get back more into her normal routine. Notes a little more soreness deu to this but has not needed to take pain meds. Still notes some fatigue on longer outtings with a harder time picking her foot up while walking.   Currently in Pain? Yes   Pain Score 2    Pain Location Knee   Pain Orientation Left            [facility] PT Assessment - 01/17/16 1105    Assessment   Medical Diagnosis Left TKR   Next MD Visit 02/04/16   Observation/Other Assessments   Focus on Therapeutic Outcomes (FOTO)  Knee - 60% (40% limitation)   ROM / Strength   AROM / PROM / Strength AROM;PROM;Strength   AROM   AROM Assessment Site Knee   Right/Left Knee Left   Left Knee Extension 2   Left Knee Flexion 120   PROM   PROM Assessment Site Knee   Right/Left Knee Left   Left Knee Extension 0   Left Knee Flexion 121   Strength   Strength Assessment Site Hip;Knee   Right/Left Hip Left   Left Hip Flexion 4+/5  Left Hip Extension 4/5   Left Hip ABduction 4/5   Left Hip ADduction 4/5   Right/Left Knee Left   Left Knee Flexion --  5-/5   Left Knee Extension 4/5  discomfort with resistance   Ambulation/Gait   Assistive device None   Gait Pattern Within Functional Limits   Stairs Assistance 6: Modified independent (Device/Increase time)   Stair Management Technique Alternating pattern;One rail Right;Forwards   Number of Stairs 12  x2   Height of Stairs 7   Gait Comments Pt able to ascend/descend stairs reciprocally with good eccentric control ad no longer demostrating substitution at hip           Today's Treatment  TherEx Rec Bike - interval lvl 3-4 x 6'  Manual  L patellar mobs - all directions with emphasis on medial & inferior glides  TherEx Prone Quad stretch with strap 3x30" Prone RF stretch with strap + knee elevated on rolled pillow for increased hip extension 3x30"  ROM/MMT check  Gait Gait assessment Stair  training - 2x12 steps with cues to maintain level hips allow knee to bend for eccentric lowering  TherEx Review of HEP and gym program          PT Short Term Goals - 10/21/15 1601    PT SHORT TERM GOAL #1   Title Patient will be independent with initial HEP by 11/06/15   Status Achieved           PT Long Term Goals - 01/17/16 1125    PT LONG TERM GOAL #1   Title Pt will be independent with final HEP by 01/17/16   Status Achieved   PT LONG TERM GOAL #4   Title Pt will ambulate with normal gait pattern without AD by 01/17/16   Status Achieved   PT LONG TERM GOAL #5   Title Pt will report no limitation in daily mobility or household tasks due to left knee pain or weakness by 01/17/16   Status Achieved   PT LONG TERM GOAL #6   Title Pt will demonstrate left knee AROM 3-115 or greater by 01/17/16   Status Achieved  2-120 AROM, 0-122 PROM   PT LONG TERM GOAL #7   Title Pt will demonstrate left knee and hip strength 4/5 or greater by 01/17/16   Status Achieved   PT LONG TERM GOAL #8   Title Pt will ascend/descend stairs reciprocally with normal step pattern by 01/17/16   Status Achieved               Plan - 01/17/16 1158    Clinical Impression Statement Pt has demonstrated good progress with PT with L knee AROM 2-120 & PROM 0-121. L hip strength 4/5 except flexion 4+/5, knee extension 4/5-4+/5 and flexion 5-/5. Pt reports still experiences some mild pain (up to 2/10) when more active, but has not needed to take pain meds recently and is able to complete all daily tasks without limitation due to L knee. Pt currently ambulating with normal gait pattern without AD and able to ascend/descend stairs reciprocally with good step pattern. Pt is aware of need to continue with HEP and will resume gym program once she sees MD next month. All goals met for this episode, therefore will proceed with discharge from PT for this episode.   PT Next Visit Plan Discharge   Consulted and Agree  with Plan of Care Patient      Patient will benefit from skilled therapeutic intervention in order  to improve the following deficits and impairments:  Pain, Decreased range of motion, Decreased strength, Impaired flexibility, Difficulty walking, Abnormal gait, Decreased activity tolerance, Decreased balance, Impaired perceived functional ability, Increased edema  Visit Diagnosis: Pain in left knee  Stiffness of left knee, not elsewhere classified  Difficulty in walking, not elsewhere classified  Other abnormalities of gait and mobility  Muscle weakness (generalized)       G-Codes - 02/12/16 12-22-00    Functional Assessment Tool Used Knee FOTO - 60% (40% limitation)   Functional Limitation Mobility: Walking and moving around   Mobility: Walking and Moving Around Goal Status [id number]) At least 40 percent but less than 60 percent impaired, limited or restricted   Mobility: Walking and Moving Around Discharge Status [phone]) At least 40 percent but less than 60 percent impaired, limited or restricted      Problem List Patient Active Problem List   Diagnosis Date Noted  . 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  [place], [place], [address] Phone: [phone]   Fax:  [phone]  Name: Elizabeth Singh MRN: [id number] Date of Birth: [birth year]   PHYSICAL THERAPY DISCHARGE SUMMARY  Visits from Start of Care: 30  Current functional level related to goals / functional outcomes:   Pt has demonstrated good progress with PT with L knee AROM 2-120 & PROM 0-121. L hip strength 4/5 except flexion 4+/5, knee extension 4/5-4+/5 and flexion 5-/5. Pt reports still experiences some mild pain (up to 2/10) when more active, but has not needed to take pain meds recently and is able to complete all daily tasks without limitation due to L knee. Pt currently  ambulating with normal gait pattern without AD and able to ascend/descend stairs reciprocally with good step pattern. Pt is aware of need to continue with HEP and will resume gym program once she sees MD next month. All goals met for this episode, therefore will proceed with discharge from PT for this episode.   Remaining deficits:   Mild knee pain with increased activity, mild hip and quad weakness   Education / Equipment:   HEP & gym program   Plan: Patient agrees to discharge.  Patient goals were met. Patient is being discharged due to meeting the stated rehab goals.  ?????       Percival Spanish, PT, MPT 02/12/16, 12:09 PM  [facility] [address]  [place], [place], [address] Phone: [phone]   Fax:  [phone]

## 2016-03-26 ENCOUNTER — Other Ambulatory Visit: Payer: Self-pay | Admitting: Obstetrics and Gynecology

## 2016-03-26 DIAGNOSIS — R928 Other abnormal and inconclusive findings on diagnostic imaging of breast: Secondary | ICD-10-CM

## 2016-04-03 ENCOUNTER — Ambulatory Visit
Admission: RE | Admit: 2016-04-03 | Discharge: 2016-04-03 | Disposition: A | Discharge status: Home / Self Care | Payer: Medicare HMO | Source: Ambulatory Visit | Attending: Obstetrics and Gynecology | Admitting: Obstetrics and Gynecology

## 2016-04-03 DIAGNOSIS — R928 Other abnormal and inconclusive findings on diagnostic imaging of breast: Secondary | ICD-10-CM

## 2016-04-23 ENCOUNTER — Other Ambulatory Visit: Payer: Self-pay | Admitting: General Surgery

## 2016-04-23 DIAGNOSIS — D241 Benign neoplasm of right breast: Secondary | ICD-10-CM

## 2016-04-29 ENCOUNTER — Other Ambulatory Visit: Payer: Self-pay | Admitting: General Surgery

## 2016-04-29 DIAGNOSIS — D241 Benign neoplasm of right breast: Secondary | ICD-10-CM

## 2016-05-05 ENCOUNTER — Encounter (HOSPITAL_BASED_OUTPATIENT_CLINIC_OR_DEPARTMENT_OTHER): Payer: Self-pay | Admitting: *Deleted

## 2016-05-11 ENCOUNTER — Ambulatory Visit
Admission: RE | Admit: 2016-05-11 | Discharge: 2016-05-11 | Disposition: A | Discharge status: Home / Self Care | Payer: Medicare HMO | Source: Ambulatory Visit | Attending: General Surgery | Admitting: General Surgery

## 2016-05-11 DIAGNOSIS — D241 Benign neoplasm of right breast: Secondary | ICD-10-CM

## 2016-05-11 NOTE — H&P (Signed)
Elizabeth Singh Location: [facility] Patient #: [id number] DOB: 02/11/1951 Widowed / Language: [place] / Race: Black or African American Female       History of Present Illness  The patient is a 65 year old female who presents with a breast mass. This is a very pleasant 65 year old [place] American female. 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 She presents today requesting that I excised a fibroadenoma from her right breast. Her primary care physician is Drake Leach in [facility]. She was referred by Malka So at the Breast Ctr[facility].       The only breast problems she's had in the past was a left breast biopsy for fibroadenoma in 2001. She gets annual mammograms. Recent mammograms were performed and [place] and read by Luberta Robertson. The left breast looked fine and the right breast showed an area of calcifications laterally. Image guided biopsy showed fibroadenoma and usual ductal hyperplasia and calcifications. She understands that this is a low risk finding but strongly would like this excised because she doesn't want anything in her breast. I told her we would do this.       Past history reveals left breast fibroadenoma 2001. Open cholecystectomy. Bilateral total knee replacement. Hypertension. Glaucoma.       family history reveals breast cancer in a great aunt diagnosed at age 39. Mother died at age 16 of uremia. She doesn't know anything about her father. Brother had colon cancer. Social history reveals she lives in [place]. Is widowed. She is retired Marine scientist. Quit smoking 20 years ago. Drinks alcohol occasionally. Has 1 child.      She will be scheduled for right breast lumpectomy with radioactive seed localization. I discussed the indications, details, techniques, and numerous risk of the surgery with her. She is aware of the risk of bleeding, infection, cosmetic deformity, reoperation of cancer, nerve damage with chronic pain, and other  unforeseen problems. She understands all these issues well. All of her questions were answered. She agrees with this plan.   Other Problems  Arthritis Cholelithiasis Diverticulosis Gastroesophageal Reflux Disease Hemorrhoids High blood pressure Hypercholesterolemia Lump In Breast  Past Surgical History  Appendectomy Breast Biopsy Bilateral. Colon Polyp Removal - Colonoscopy Gallbladder Surgery - Open Knee Surgery Bilateral.  Diagnostic Studies History  Colonoscopy 1-5 years ago Mammogram within last year Pap Smear 1-5 years ago  Allergies  Sulfa 10 *OPHTHALMIC AGENTS* Itching.  Medication History  Metoprolol Tartrate (50MG  Tablet, Oral) Active. AMILoride-HydroCHLOROthiazide (5-50MG  Tablet, Oral) Active. Latanoprost (0.005% Solution, Ophthalmic) Active. Klor-Con M20 [facility] Tablet ER, Oral) Active. EPINEPHrine (0.3MG /0.3ML Soln Auto-inj, Injection as needed) Active. Medications Reconciled  Social History  Alcohol use Occasional alcohol use. Caffeine use Coffee, Tea. No drug use Tobacco use Former smoker.  Family History  Alcohol Abuse Brother, Mother. Arthritis Brother, Daughter, Family Members In General. Breast Cancer Family Members In [place]. Diabetes Mellitus Brother. Heart Disease Sister. Heart disease in female family member before age 25 Hypertension Brother, Mother. Kidney Disease Brother, Mother. Prostate Cancer Brother.  Pregnancy / Birth History  Age at menarche 46 years. Age of menopause 51-55 Contraceptive History Oral contraceptives. Gravida 1 Maternal age 45-25 Para 1    Review of Systems  General Not Present- Appetite Loss, Chills, Fatigue, Fever, Night Sweats, Weight Gain and Weight Loss. Skin Not Present- Change in Wart/Mole, Dryness, Hives, Jaundice, New Lesions, Non-Healing Wounds, Rash and Ulcer. HEENT Present- Seasonal Allergies and Wears glasses/contact lenses.  Not Present- Earache, Hearing Loss, Hoarseness, Nose Bleed,  Oral Ulcers, Ringing in the Ears, Sinus Pain, Sore Throat, Visual Disturbances and Yellow Eyes. Respiratory Not Present- Bloody sputum, Chronic Cough, Difficulty Breathing, Snoring and Wheezing. Breast Present- Breast Mass. Not Present- Breast Pain, Nipple Discharge and Skin Changes. Cardiovascular Not Present- Chest Pain, Difficulty Breathing Lying Down, Leg Cramps, Palpitations, Rapid Heart Rate, Shortness of Breath and Swelling of Extremities. Gastrointestinal Present- Constipation. Not Present- Abdominal Pain, Bloating, Bloody Stool, Change in Bowel Habits, Chronic diarrhea, Difficulty Swallowing, Excessive gas, Gets full quickly at meals, Hemorrhoids, Indigestion, Nausea, Rectal Pain and Vomiting. Female Genitourinary Not Present- Frequency, Nocturia, Painful Urination, Pelvic Pain and Urgency. Musculoskeletal Present- Joint Pain, Joint Stiffness and Swelling of Extremities. Not Present- Back Pain, Muscle Pain and Muscle Weakness. Neurological Not Present- Decreased Memory, Fainting, Headaches, Numbness, Seizures, Tingling, Tremor, Trouble walking and Weakness. Psychiatric Not Present- Anxiety, Bipolar, Change in Sleep Pattern, Depression, Fearful and Frequent crying. Endocrine Not Present- Cold Intolerance, Excessive Hunger, Hair Changes, Heat Intolerance, Hot flashes and New Diabetes. Hematology Present- Blood Thinners. Not Present- Easy Bruising, Excessive bleeding, Gland problems, HIV and Persistent Infections.  Vitals  Weight: 213 lb Height: 64in Body Surface Area: 2.01 m Body Mass Index: 36.56 kg/m  Temp.: 97.85F(Temporal)  Pulse: 63 (Regular)  BP: 128/72 (Sitting, Left Arm, Standard)     Physical Exam General Mental Status-Alert. General Appearance-Consistent with stated age. Hydration-Well hydrated. Voice-Normal.  Head and Neck Head-normocephalic, atraumatic with no lesions or palpable  masses. Trachea-midline. Thyroid Gland Characteristics - normal size and consistency.  Eye Eyeball - Bilateral-Extraocular movements intact. Sclera/Conjunctiva - Bilateral-No scleral icterus.  Chest and Lung Exam Chest and lung exam reveals -quiet, even and easy respiratory effort with no use of accessory muscles and on auscultation, normal breath sounds, no adventitious sounds and normal vocal resonance. Inspection Chest Wall - Normal. Back - normal.  Breast Note: Well-healed scar left breast laterally. Breast are large. No palpable mass in either breast. No nipple discharge or skin changes. No axillary adenopathy.   Cardiovascular Cardiovascular examination reveals -normal heart sounds, regular rate and rhythm with no murmurs and normal pedal pulses bilaterally.  Abdomen Inspection Inspection of the abdomen reveals - No Hernias. Skin - Scar - Note: Healed right subcostal incision. Palpation/Percussion Palpation and Percussion of the abdomen reveal - Soft, Non Tender, No Rebound tenderness, No Rigidity (guarding) and No hepatosplenomegaly. Auscultation Auscultation of the abdomen reveals - Bowel sounds normal.  Neurologic Neurologic evaluation reveals -alert and oriented x 3 with no impairment of recent or remote memory. Mental Status-Normal.  Musculoskeletal Normal Exam - Left-Upper Extremity Strength Normal and Lower Extremity Strength Normal. Normal Exam - Right-Upper Extremity Strength Normal and Lower Extremity Strength Normal.  Lymphatic Head & Neck  General Head & Neck Lymphatics: Bilateral - Description - Normal. Axillary  General Axillary Region: Bilateral - Description - Normal. Tenderness - Non Tender. Femoral & Inguinal  Generalized Femoral & Inguinal Lymphatics: Bilateral - Description - Normal. Tenderness - Non Tender.    Assessment & Plan [facility] M. Dalbert Batman MD; 04/23/2016 9:30 AM) BREAST FIBROADENOMA, RIGHT (D24.1) Current Plans Pt  Education - Pamphlet Given - Breast Biopsy: discussed with patient and provided information. You are being scheduled for surgery - Our schedulers will call you. You should hear from our office's scheduling department within 5 working days about the location, date, and time of surgery. We try to make accommodations for patient's preferences in scheduling surgery, but sometimes the OR schedule or the surgeon's schedule prevents [place] from making those accommodations. If you have not  heard from our office [phone]) in 5 working days, call the office and ask for your surgeon's nurse. If you have other questions about your diagnosis, plan, or surgery, call the office and ask for your surgeon's nurse.   Your recent imaging studies showed an area of calcifications in the lateral right breast that was not there one year ago Image guided biopsy shows calcifications and a fibroadenoma. This correlates. You have requested that this be excised. You are aware that this is a low risk finding He will be scheduled for right breast lumpectomy with radioactive seed localization We have discussed the indications, techniques and numerous risk of this procedure.  HYPERTENSION, BENIGN (I10) HISTORY OF CHOLECYSTECTOMY (Z90.49) HISTORY OF TOTAL BILATERAL KNEE REPLACEMENT [id number]) HISTORY OF LEFT BREAST BIOPSY (Z98.890) Impression: Benign fibroadenoma, 2001.    Edsel Petrin. Dalbert Batman, M.D., [facility] General and Minimally invasive Surgery Breast and Colorectal Surgery Office:   [phone] Pager:   [phone]

## 2016-05-11 NOTE — Anesthesia Preprocedure Evaluation (Addendum)
Anesthesia Evaluation  Patient identified by MRN, date of birth, ID band Patient awake    Reviewed: Allergy & Precautions, NPO status , Patient's Chart, lab work & pertinent test results, reviewed documented beta blocker date and time   History of Anesthesia Complications (+) PONV and history of anesthetic complications  Airway Mallampati: II  TM Distance: >3 FB Neck ROM: Full    Dental  (+) Teeth Intact, Dental Advisory Given   Pulmonary neg shortness of breath, neg sleep apnea, neg COPD, neg recent URI, former smoker,    Pulmonary exam normal breath sounds clear to auscultation       Cardiovascular hypertension, Pt. on medications and Pt. on home beta blockers (-) angina(-) Past MI and (-) Cardiac Stents  Rhythm:Regular Rate:Normal     Neuro/Psych  Headaches, neg Seizures    GI/Hepatic Neg liver ROS, hiatal hernia, diverticulosis   Endo/Other  neg diabetesBenign thyroid goiter  Renal/GU negative Renal ROS     Musculoskeletal  (+) Arthritis ,   Abdominal (+) + obese,   Peds  Hematology negative hematology ROS (+)   Anesthesia Other Findings HLD, glaucoma, fibroadenoma of breast  Reproductive/Obstetrics                            Anesthesia Physical Anesthesia Plan  ASA: II  Anesthesia Plan: General   Post-op Pain Management:    Induction: Intravenous  Airway Management Planned: LMA  Additional Equipment:   Intra-op Plan:   Post-operative Plan: Extubation in OR  Informed Consent: I have reviewed the patients History and Physical, chart, labs and discussed the procedure including the risks, benefits and alternatives for the proposed anesthesia with the patient or authorized representative who has indicated his/her understanding and acceptance.   Dental advisory given  Plan Discussed with:   Anesthesia Plan Comments:       Anesthesia Quick Evaluation

## 2016-05-11 NOTE — Pre-Procedure Instructions (Signed)
Boost breeze given and patient to drink by 0500 DOS. Voiced understanding

## 2016-05-12 ENCOUNTER — Ambulatory Visit (HOSPITAL_BASED_OUTPATIENT_CLINIC_OR_DEPARTMENT_OTHER): Payer: Medicare HMO | Admitting: Certified Registered"

## 2016-05-12 ENCOUNTER — Encounter (HOSPITAL_BASED_OUTPATIENT_CLINIC_OR_DEPARTMENT_OTHER): Payer: Self-pay | Admitting: Certified Registered"

## 2016-05-12 ENCOUNTER — Encounter (HOSPITAL_BASED_OUTPATIENT_CLINIC_OR_DEPARTMENT_OTHER)
Admission: RE | Disposition: A | Discharge status: Home / Self Care | Payer: Self-pay | Source: Ambulatory Visit | Attending: General Surgery

## 2016-05-12 ENCOUNTER — Ambulatory Visit
Admission: RE | Admit: 2016-05-12 | Discharge: 2016-05-12 | Disposition: A | Discharge status: Home / Self Care | Payer: Medicare HMO | Source: Ambulatory Visit | Attending: General Surgery | Admitting: General Surgery

## 2016-05-12 ENCOUNTER — Ambulatory Visit (HOSPITAL_BASED_OUTPATIENT_CLINIC_OR_DEPARTMENT_OTHER)
Admission: RE | Admit: 2016-05-12 | Discharge: 2016-05-12 | Disposition: A | Discharge status: Home / Self Care | Payer: Medicare HMO | Source: Ambulatory Visit | Attending: General Surgery | Admitting: General Surgery

## 2016-05-12 DIAGNOSIS — I10 Essential (primary) hypertension: Secondary | ICD-10-CM | POA: Diagnosis not present

## 2016-05-12 DIAGNOSIS — H409 Unspecified glaucoma: Secondary | ICD-10-CM | POA: Diagnosis not present

## 2016-05-12 DIAGNOSIS — Z803 Family history of malignant neoplasm of breast: Secondary | ICD-10-CM | POA: Diagnosis not present

## 2016-05-12 DIAGNOSIS — Z96653 Presence of artificial knee joint, bilateral: Secondary | ICD-10-CM | POA: Diagnosis not present

## 2016-05-12 DIAGNOSIS — Z6837 Body mass index (BMI) 37.0-37.9, adult: Secondary | ICD-10-CM | POA: Insufficient documentation

## 2016-05-12 DIAGNOSIS — Z87891 Personal history of nicotine dependence: Secondary | ICD-10-CM | POA: Insufficient documentation

## 2016-05-12 DIAGNOSIS — Z79899 Other long term (current) drug therapy: Secondary | ICD-10-CM | POA: Insufficient documentation

## 2016-05-12 DIAGNOSIS — E669 Obesity, unspecified: Secondary | ICD-10-CM | POA: Insufficient documentation

## 2016-05-12 DIAGNOSIS — D241 Benign neoplasm of right breast: Secondary | ICD-10-CM | POA: Insufficient documentation

## 2016-05-12 HISTORY — PX: BREAST LUMPECTOMY WITH RADIOACTIVE SEED LOCALIZATION: SHX6424

## 2016-05-12 SURGERY — BREAST LUMPECTOMY WITH RADIOACTIVE SEED LOCALIZATION
Anesthesia: General | Site: Breast | Laterality: Right

## 2016-05-12 MED ORDER — CEFAZOLIN SODIUM-DEXTROSE 2-4 GM/100ML-% IV SOLN
INTRAVENOUS | Status: AC
  Filled 2016-05-12: qty 100

## 2016-05-12 MED ORDER — LIDOCAINE 2% (20 MG/ML) 5 ML SYRINGE
INTRAMUSCULAR | Status: AC
  Filled 2016-05-12: qty 5

## 2016-05-12 MED ORDER — PROPOFOL 500 MG/50ML IV EMUL
INTRAVENOUS | Status: AC
  Filled 2016-05-12: qty 50

## 2016-05-12 MED ORDER — ACETAMINOPHEN 500 MG PO TABS
ORAL_TABLET | ORAL | Status: AC
  Filled 2016-05-12: qty 2

## 2016-05-12 MED ORDER — CELECOXIB 400 MG PO CAPS
400.0000 mg | ORAL_CAPSULE | ORAL | Status: AC
  Administered 2016-05-12: 400 mg via ORAL

## 2016-05-12 MED ORDER — OXYCODONE HCL 5 MG PO TABS
ORAL_TABLET | ORAL | Status: AC
  Filled 2016-05-12: qty 1

## 2016-05-12 MED ORDER — LACTATED RINGERS IV SOLN
INTRAVENOUS | Status: DC
  Administered 2016-05-12 (×2): via INTRAVENOUS

## 2016-05-12 MED ORDER — PROMETHAZINE HCL 25 MG/ML IJ SOLN: 6.2500 mg | INTRAMUSCULAR | Status: DC | PRN

## 2016-05-12 MED ORDER — CHLORHEXIDINE GLUCONATE CLOTH 2 % EX PADS: 6.0000 | MEDICATED_PAD | Freq: Once | CUTANEOUS | Status: DC

## 2016-05-12 MED ORDER — METHYLENE BLUE 0.5 % INJ SOLN
INTRAVENOUS | Status: AC
  Filled 2016-05-12: qty 10

## 2016-05-12 MED ORDER — ONDANSETRON HCL 4 MG/2ML IJ SOLN
INTRAMUSCULAR | Status: AC
  Filled 2016-05-12: qty 2

## 2016-05-12 MED ORDER — DEXAMETHASONE SODIUM PHOSPHATE 10 MG/ML IJ SOLN
INTRAMUSCULAR | Status: AC
  Filled 2016-05-12: qty 1

## 2016-05-12 MED ORDER — ONDANSETRON HCL 4 MG/2ML IJ SOLN
INTRAMUSCULAR | Status: DC | PRN
  Administered 2016-05-12: 4 mg via INTRAVENOUS

## 2016-05-12 MED ORDER — CEFAZOLIN SODIUM-DEXTROSE 2-4 GM/100ML-% IV SOLN
2.0000 g | INTRAVENOUS | Status: AC
  Administered 2016-05-12: 2 g via INTRAVENOUS

## 2016-05-12 MED ORDER — OXYCODONE HCL 5 MG PO TABS
5.0000 mg | ORAL_TABLET | Freq: Once | ORAL | Status: AC
  Administered 2016-05-12: 5 mg via ORAL

## 2016-05-12 MED ORDER — DEXAMETHASONE SODIUM PHOSPHATE 4 MG/ML IJ SOLN
INTRAMUSCULAR | Status: DC | PRN
  Administered 2016-05-12: 10 mg via INTRAVENOUS

## 2016-05-12 MED ORDER — GLYCOPYRROLATE 0.2 MG/ML IJ SOLN
0.2000 mg | Freq: Once | INTRAMUSCULAR | Status: AC | PRN
  Administered 2016-05-12: 0.1 mg via INTRAVENOUS

## 2016-05-12 MED ORDER — FENTANYL CITRATE (PF) 100 MCG/2ML IJ SOLN
50.0000 ug | INTRAMUSCULAR | Status: DC | PRN
  Administered 2016-05-12: 50 ug via INTRAVENOUS

## 2016-05-12 MED ORDER — PROPOFOL 10 MG/ML IV BOLUS
INTRAVENOUS | Status: DC | PRN
  Administered 2016-05-12: 200 mg via INTRAVENOUS

## 2016-05-12 MED ORDER — FENTANYL CITRATE (PF) 100 MCG/2ML IJ SOLN
INTRAMUSCULAR | Status: AC
  Filled 2016-05-12: qty 2

## 2016-05-12 MED ORDER — EPHEDRINE 5 MG/ML INJ
INTRAVENOUS | Status: AC
  Filled 2016-05-12: qty 10

## 2016-05-12 MED ORDER — LIDOCAINE HCL (CARDIAC) 20 MG/ML IV SOLN
INTRAVENOUS | Status: DC | PRN
  Administered 2016-05-12: 60 mg via INTRAVENOUS

## 2016-05-12 MED ORDER — MIDAZOLAM HCL 2 MG/2ML IJ SOLN: 1.0000 mg | INTRAMUSCULAR | Status: DC | PRN

## 2016-05-12 MED ORDER — SCOPOLAMINE 1 MG/3DAYS TD PT72: 1.0000 | MEDICATED_PATCH | Freq: Once | TRANSDERMAL | Status: DC | PRN

## 2016-05-12 MED ORDER — BUPIVACAINE-EPINEPHRINE (PF) 0.5% -1:200000 IJ SOLN
INTRAMUSCULAR | Status: DC | PRN
  Administered 2016-05-12: 10 mL via PERINEURAL

## 2016-05-12 MED ORDER — EPHEDRINE SULFATE 50 MG/ML IJ SOLN
INTRAMUSCULAR | Status: DC | PRN
  Administered 2016-05-12 (×2): 10 mg via INTRAVENOUS

## 2016-05-12 MED ORDER — BUPIVACAINE-EPINEPHRINE (PF) 0.5% -1:200000 IJ SOLN
INTRAMUSCULAR | Status: AC
  Filled 2016-05-12: qty 30

## 2016-05-12 MED ORDER — FENTANYL CITRATE (PF) 100 MCG/2ML IJ SOLN: 25.0000 ug | INTRAMUSCULAR | Status: DC | PRN

## 2016-05-12 MED ORDER — ACETAMINOPHEN 500 MG PO TABS
1000.0000 mg | ORAL_TABLET | ORAL | Status: AC
  Administered 2016-05-12: 1000 mg via ORAL

## 2016-05-12 MED ORDER — OXYCODONE HCL 5 MG PO TABS: 5.0000 mg | ORAL_TABLET | Freq: Four times a day (QID) | ORAL | 0 refills | Status: DC | PRN

## 2016-05-12 MED ORDER — SODIUM CHLORIDE 0.9 % IJ SOLN
INTRAMUSCULAR | Status: AC
  Filled 2016-05-12: qty 10

## 2016-05-12 MED ORDER — GABAPENTIN 300 MG PO CAPS
ORAL_CAPSULE | ORAL | Status: AC
  Filled 2016-05-12: qty 1

## 2016-05-12 MED ORDER — GABAPENTIN 300 MG PO CAPS
300.0000 mg | ORAL_CAPSULE | ORAL | Status: AC
  Administered 2016-05-12: 300 mg via ORAL

## 2016-05-12 MED ORDER — CELECOXIB 200 MG PO CAPS
ORAL_CAPSULE | ORAL | Status: AC
  Filled 2016-05-12: qty 2

## 2016-05-12 SURGICAL SUPPLY — 60 items
APPLIER CLIP 9.375 MED OPEN (MISCELLANEOUS)
BENZOIN TINCTURE PRP APPL 2/3 (GAUZE/BANDAGES/DRESSINGS) ×3 IMPLANT
BINDER BREAST LRG (GAUZE/BANDAGES/DRESSINGS) IMPLANT
BINDER BREAST MEDIUM (GAUZE/BANDAGES/DRESSINGS) IMPLANT
BINDER BREAST XLRG (GAUZE/BANDAGES/DRESSINGS) IMPLANT
BINDER BREAST XXLRG (GAUZE/BANDAGES/DRESSINGS) ×3 IMPLANT
BLADE HEX COATED 2.75 (ELECTRODE) ×3 IMPLANT
BLADE SURG 10 STRL SS (BLADE) IMPLANT
BLADE SURG 15 STRL LF DISP TIS (BLADE) ×1 IMPLANT
BLADE SURG 15 STRL SS (BLADE) ×2
CANISTER SUC SOCK COL 7IN (MISCELLANEOUS) IMPLANT
CANISTER SUCT 1200ML W/VALVE (MISCELLANEOUS) ×3 IMPLANT
CHLORAPREP W/TINT 26ML (MISCELLANEOUS) ×3 IMPLANT
CLIP APPLIE 9.375 MED OPEN (MISCELLANEOUS) IMPLANT
CLOSURE WOUND 1/2 X4 (GAUZE/BANDAGES/DRESSINGS)
COVER BACK TABLE 60X90IN (DRAPES) ×3 IMPLANT
COVER MAYO STAND STRL (DRAPES) ×3 IMPLANT
COVER PROBE W GEL 5X96 (DRAPES) ×3 IMPLANT
DECANTER SPIKE VIAL GLASS SM (MISCELLANEOUS) IMPLANT
DERMABOND ADVANCED (GAUZE/BANDAGES/DRESSINGS) ×2
DERMABOND ADVANCED .7 DNX12 (GAUZE/BANDAGES/DRESSINGS) ×1 IMPLANT
DEVICE DUBIN W/COMP PLATE 8390 (MISCELLANEOUS) ×3 IMPLANT
DRAPE LAPAROSCOPIC ABDOMINAL (DRAPES) ×3 IMPLANT
DRAPE UTILITY XL STRL (DRAPES) ×3 IMPLANT
DRSG PAD ABDOMINAL 8X10 ST (GAUZE/BANDAGES/DRESSINGS) ×3 IMPLANT
ELECT REM PT RETURN 9FT ADLT (ELECTROSURGICAL) ×3
ELECTRODE REM PT RTRN 9FT ADLT (ELECTROSURGICAL) ×1 IMPLANT
GLOVE EUDERMIC 7 POWDERFREE (GLOVE) ×3 IMPLANT
GOWN STRL REUS W/ TWL LRG LVL3 (GOWN DISPOSABLE) ×1 IMPLANT
GOWN STRL REUS W/ TWL XL LVL3 (GOWN DISPOSABLE) ×1 IMPLANT
GOWN STRL REUS W/TWL LRG LVL3 (GOWN DISPOSABLE) ×2
GOWN STRL REUS W/TWL XL LVL3 (GOWN DISPOSABLE) ×2
ILLUMINATOR WAVEGUIDE N/F (MISCELLANEOUS) IMPLANT
KIT MARKER MARGIN INK (KITS) ×3 IMPLANT
LIGHT WAVEGUIDE WIDE FLAT (MISCELLANEOUS) IMPLANT
NEEDLE HYPO 25X1 1.5 SAFETY (NEEDLE) ×3 IMPLANT
NS IRRIG 1000ML POUR BTL (IV SOLUTION) ×3 IMPLANT
PACK BASIN DAY SURGERY FS (CUSTOM PROCEDURE TRAY) ×3 IMPLANT
PENCIL BUTTON HOLSTER BLD 10FT (ELECTRODE) ×3 IMPLANT
SHEET MEDIUM DRAPE 40X70 STRL (DRAPES) IMPLANT
SLEEVE SCD COMPRESS KNEE MED (MISCELLANEOUS) ×3 IMPLANT
SPONGE GAUZE 4X4 12PLY STER LF (GAUZE/BANDAGES/DRESSINGS) ×3 IMPLANT
SPONGE LAP 18X18 X RAY DECT (DISPOSABLE) IMPLANT
SPONGE LAP 4X18 X RAY DECT (DISPOSABLE) ×3 IMPLANT
STRIP CLOSURE SKIN 1/2X4 (GAUZE/BANDAGES/DRESSINGS) IMPLANT
SUT ETHILON 3 0 FSL (SUTURE) IMPLANT
SUT MNCRL AB 4-0 PS2 18 (SUTURE) ×3 IMPLANT
SUT SILK 2 0 SH (SUTURE) ×3 IMPLANT
SUT VIC AB 2-0 CT1 27 (SUTURE)
SUT VIC AB 2-0 CT1 TAPERPNT 27 (SUTURE) IMPLANT
SUT VIC AB 3-0 SH 27 (SUTURE)
SUT VIC AB 3-0 SH 27X BRD (SUTURE) IMPLANT
SUT VICRYL 3-0 CR8 SH (SUTURE) ×3 IMPLANT
SYRINGE 10CC LL (SYRINGE) ×3 IMPLANT
TAPE STRIPS DRAPE STRL (GAUZE/BANDAGES/DRESSINGS) ×3 IMPLANT
TOWEL OR 17X24 6PK STRL BLUE (TOWEL DISPOSABLE) ×3 IMPLANT
TOWEL OR NON WOVEN STRL DISP B (DISPOSABLE) IMPLANT
TUBE CONNECTING 20'X1/4 (TUBING) ×1
TUBE CONNECTING 20X1/4 (TUBING) ×2 IMPLANT
YANKAUER SUCT BULB TIP NO VENT (SUCTIONS) ×3 IMPLANT

## 2016-05-12 NOTE — Op Note (Signed)
Patient Name:           Elizabeth Singh   Date of Surgery:        05/12/2016  Pre op Diagnosis:      Fibroadenoma right breast  Post op Diagnosis:    Same  Procedure:                 Right breast lumpectomy with radioactive seed localization  Surgeon:                     Edsel Petrin. Dalbert Batman, M.D., FACS  Assistant:                      OR staff  Operative Indications:    This is a very pleasant 65 year old [place] American female. 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 She presents today requesting that I excise a fibroadenoma from her right breast. Her primary care physician is Drake Leach in [facility]. She was referred by Malka So at the Breast Ctr., [facility].       The only breast problems she's had in the past was a left breast biopsy for fibroadenoma in 2001. She gets annual mammograms. Recent mammograms were performed and [place] and read by Luberta Robertson. The left breast looked fine and the right breast showed an area of calcifications laterally. Image guided biopsy showed fibroadenoma and usual ductal hyperplasia and calcifications. She understands that this is a low risk finding but strongly would like this excised because she doesn't want anything in her breast. I told her we would do this.       Past history reveals left breast fibroadenoma 2001. Open cholecystectomy. Bilateral total knee replacement. Hypertension. Glaucoma.       family history reveals breast cancer in a great aunt diagnosed at age 67. Mother died at age 70 of uremia. She doesn't know anything about her father. Brother had colon cancer. Social history reveals she lives in [place]. Is widowed. She is retired Marine scientist. Quit smoking 20 years ago. Drinks alcohol occasionally. Has 1 child.      She will be scheduled for right breast lumpectomy with radioactive seed localization.   Operative Findings:       The marker clip and radioactive seed were immediately adjacent to each other in the  lateral right breast.  The specimen mammogram looked good with the marker clip and radioactive seed in the center of the specimen.  Minimal bleeding.  Procedure in Detail:          The radioactive seed was placed by the radiologist earlier this week.  The patient was brought to the operating room  and underwent general LMA anesthesia.  The right breast was prepped and draped in a sterile fashion.  Surgical timeout was performed.  Intravenous antibiotics were given.  0.5% Marcaine with epinephrine was used as local infiltration anesthetic.      I made a curvilinear, generally circumareolar type incision in the far lateral right breast, hiding the scar is much as possible.  Dissection was carried down into the breast tissue and using the neoprobe as a guide I tunneled a bit medially and dissected around the radioactive signal.  The specimen was removed and marked with silk sutures and a 6 color ink kit to orient the pathologist.  Specimen mammogram looked good and the specimen was marked with pins and sent to the lab.  Hemostasis was excellent.  The wound was irrigated.  I placed 5  metallic marker clips in the lumpectomy cavity and the cardinal positions.  The deeper breast tissues were closed with 3-0 Vicryl sutures and the skin closed with a running subcuticular 4-0 Monocryl and Dermabond.  Dry gauze bandages and a breast binder were placed.  The patient tolerated the procedure well was taken to PACU in stable condition.  EBL 10 mL.  Counts correct.  Complications none.      Edsel Petrin. Dalbert Batman, M.D., FACS General and Minimally Invasive Surgery Breast and Colorectal Surgery  05/12/2016 8:25 AM

## 2016-05-12 NOTE — Anesthesia Procedure Notes (Signed)
Procedure Name: LMA Insertion Date/Time: 05/12/2016 7:31 AM Performed by: Dylann Gallier D Pre-anesthesia Checklist: Patient identified, Emergency Drugs available, Suction available and Patient being monitored Patient Re-evaluated:Patient Re-evaluated prior to inductionOxygen Delivery Method: Circle system utilized Preoxygenation: Pre-oxygenation with 100% oxygen Intubation Type: IV induction Ventilation: Mask ventilation without difficulty LMA: LMA inserted LMA Size: 4.0 Number of attempts: 1 Airway Equipment and Method: Bite block Placement Confirmation: positive ETCO2 Tube secured with: Tape Dental Injury: Teeth and Oropharynx as per pre-operative assessment

## 2016-05-12 NOTE — Interval H&P Note (Signed)
History and Physical Interval Note:  05/12/2016 6:55 AM  Elizabeth Singh  has presented today for surgery, with the diagnosis of RIGHT BREAST FIBROADENOMA  The various methods of treatment have been discussed with the patient and family. After consideration of risks, benefits and other options for treatment, the patient has consented to  Procedure(s): RIGHT BREAST LUMPECTOMY WITH RADIOACTIVE SEED LOCALIZATION (Right) as a surgical intervention .  The patient's history has been reviewed, patient examined, no change in status, stable for surgery.  I have reviewed the patient's chart and labs.  Questions were answered to the patient's satisfaction.     [name]

## 2016-05-12 NOTE — Anesthesia Postprocedure Evaluation (Signed)
Anesthesia Post Note  Patient: Elizabeth Singh  Procedure(s) Performed: Procedure(s) (LRB): RIGHT BREAST LUMPECTOMY WITH RADIOACTIVE SEED LOCALIZATION (Right)  Patient location during evaluation: PACU Anesthesia Type: General Level of consciousness: awake and alert Pain management: pain level controlled Vital Signs Assessment: post-procedure vital signs reviewed and stable Respiratory status: spontaneous breathing, nonlabored ventilation, respiratory function stable and patient connected to nasal cannula oxygen Cardiovascular status: blood pressure returned to baseline and stable Postop Assessment: no signs of nausea or vomiting Anesthetic complications: no    Last Vitals:  Vitals:   05/12/16 0845 05/12/16 0915  BP: 136/76 138/62  Pulse: 85 82  Resp: 13 16  Temp:  36.5 C    Last Pain:  Vitals:   05/12/16 0915  TempSrc: Oral  PainSc:                  Nilda Simmer

## 2016-05-12 NOTE — Discharge Instructions (Signed)
° °Post Anesthesia Home Care Instructions ° °Activity: °Get plenty of rest for the remainder of the day. A responsible adult should stay with you for 24 hours following the procedure.  °For the next 24 hours, DO NOT: °-Drive a car °-Operate machinery °-Drink alcoholic beverages °-Take any medication unless instructed by your physician °-Make any legal decisions or sign important papers. ° °Meals: °Start with liquid foods such as gelatin or soup. Progress to regular foods as tolerated. Avoid greasy, spicy, heavy foods. If nausea and/or vomiting occur, drink only clear liquids until the nausea and/or vomiting subsides. Call your physician if vomiting continues. ° °Special Instructions/Symptoms: °Your throat may feel dry or sore from the anesthesia or the breathing tube placed in your throat during surgery. If this causes discomfort, gargle with warm salt water. The discomfort should disappear within 24 hours. ° °If you had a scopolamine patch placed behind your ear for the management of post- operative nausea and/or vomiting: ° °1. The medication in the patch is effective for 72 hours, after which it should be removed.  Wrap patch in a tissue and discard in the trash. Wash hands thoroughly with soap and water. °2. You may remove the patch earlier than 72 hours if you experience unpleasant side effects which may include dry mouth, dizziness or visual disturbances. °3. Avoid touching the patch. Wash your hands with soap and water after contact with the patch. °  ° ° °[facility] °Office Phone Number [phone] ° °BREAST BIOPSY/ PARTIAL MASTECTOMY: POST OP INSTRUCTIONS ° °Always review your discharge instruction sheet given to you by the facility where your surgery was performed. ° °IF YOU HAVE DISABILITY OR FAMILY LEAVE FORMS, YOU MUST BRING THEM TO THE OFFICE FOR PROCESSING.  DO NOT GIVE THEM TO YOUR DOCTOR. ° °1. A prescription for pain medication may be given to you upon discharge.  Take your pain  medication as prescribed, if needed.  If narcotic pain medicine is not needed, then you may take acetaminophen (Tylenol) or ibuprofen (Advil) as needed. °2. Take your usually prescribed medications unless otherwise directed °3. If you need a refill on your pain medication, please contact your pharmacy.  They will contact our office to request authorization.  Prescriptions will not be filled after 5pm or on week-ends. °4. You should eat very light the first 24 hours after surgery, such as soup, crackers, pudding, etc.  Resume your normal diet the day after surgery. °5. Most patients will experience some swelling and bruising in the breast.  Ice packs and a good support bra will help.  Swelling and bruising can take several days to resolve.  °6. It is common to experience some constipation if taking pain medication after surgery.  Increasing fluid intake and taking a stool softener will usually help or prevent this problem from occurring.  A mild laxative (Milk of Magnesia or Miralax) should be taken according to package directions if there are no bowel movements after 48 hours. °7. Unless discharge instructions indicate otherwise, you may remove your bandages 24-48 hours after surgery, and you may shower at that time.  You may have steri-strips (small skin tapes) in place directly over the incision.  These strips should be left on the skin for 7-10 days.  If your surgeon used skin glue on the incision, you may shower in 24 hours.  The glue will flake off over the next 2-3 weeks.  Any sutures or staples will be removed at the office during your follow-up visit. °8.   ACTIVITIES:  You may resume regular daily activities (gradually increasing) beginning the next day.  Wearing a good support bra or sports bra minimizes pain and swelling.  You may have sexual intercourse when it is comfortable. °a. You may drive when you no longer are taking prescription pain medication, you can comfortably wear a seatbelt, and you can  safely maneuver your car and apply brakes. °b. RETURN TO WORK:  ______________________________________________________________________________________ °9. You should see your doctor in the office for a follow-up appointment approximately two weeks after your surgery.  Your doctor’s nurse will typically make your follow-up appointment when she calls you with your pathology report.  Expect your pathology report 2-3 business days after your surgery.  You may call to check if you do not hear from us after three days. °10. OTHER INSTRUCTIONS: _______________________________________________________________________________________________ _____________________________________________________________________________________________________________________________________ °_____________________________________________________________________________________________________________________________________ °_____________________________________________________________________________________________________________________________________ ° °WHEN TO CALL YOUR DOCTOR: °1. Fever over 101.0 °2. Nausea and/or vomiting. °3. Extreme swelling or bruising. °4. Continued bleeding from incision. °5. Increased pain, redness, or drainage from the incision. ° °The clinic staff is available to answer your questions during regular business hours.  Please don’t hesitate to call and ask to speak to one of the nurses for clinical concerns.  If you have a medical emergency, go to the nearest emergency room or call 911.  A surgeon from [facility] is always on call at the hospital. ° °For further questions, please visit [email or web address]  °

## 2016-05-12 NOTE — Transfer of Care (Signed)
Immediate Anesthesia Transfer of Care Note  Patient: Elizabeth Singh  Procedure(s) Performed: Procedure(s) with comments: RIGHT BREAST LUMPECTOMY WITH RADIOACTIVE SEED LOCALIZATION (Right) - RIGHT BREAST LUMPECTOMY WITH RADIOACTIVE SEED LOCALIZATION  Patient Location: PACU  Anesthesia Type:General  Level of Consciousness: awake and patient cooperative  Airway & Oxygen Therapy: Patient Spontanous Breathing and Patient connected to face mask oxygen  Post-op Assessment: Report given to RN and Post -op Vital signs reviewed and stable  Post vital signs: Reviewed and stable  Last Vitals:  Vitals:   05/12/16 0823 05/12/16 0824  BP: 119/64   Pulse: 85 84  Resp:  11  Temp:      Last Pain:  Vitals:   05/12/16 0635  TempSrc: Oral         Complications: No apparent anesthesia complications

## 2016-05-13 NOTE — Progress Notes (Signed)
Inform patient of Pathology report,.  Right breast lumpectomy is completely benign.  Fibroadenoma and fibrocystic changes.  No further surgery will be necessary.  I will discuss with her in detail at her first visit.`   hmi

## 2016-05-14 ENCOUNTER — Encounter (HOSPITAL_BASED_OUTPATIENT_CLINIC_OR_DEPARTMENT_OTHER): Payer: Self-pay | Admitting: General Surgery

## 2017-05-28 ENCOUNTER — Other Ambulatory Visit: Payer: Self-pay | Admitting: Obstetrics and Gynecology

## 2017-05-28 DIAGNOSIS — Z1231 Encounter for screening mammogram for malignant neoplasm of breast: Secondary | ICD-10-CM

## 2017-06-09 ENCOUNTER — Ambulatory Visit
Admission: RE | Admit: 2017-06-09 | Discharge: 2017-06-09 | Disposition: A | Discharge status: Home / Self Care | Payer: Medicare HMO | Source: Ambulatory Visit | Attending: Obstetrics and Gynecology | Admitting: Obstetrics and Gynecology

## 2017-06-09 DIAGNOSIS — Z1231 Encounter for screening mammogram for malignant neoplasm of breast: Secondary | ICD-10-CM

## 2017-10-20 ENCOUNTER — Encounter: Payer: Self-pay | Admitting: Podiatry

## 2017-10-20 ENCOUNTER — Ambulatory Visit (INDEPENDENT_AMBULATORY_CARE_PROVIDER_SITE_OTHER): Payer: Medicare HMO | Admitting: Podiatry

## 2017-10-20 VITALS — BP 122/60 | HR 60 | Ht 64.0 in | Wt 224.0 lb

## 2017-10-20 DIAGNOSIS — M79674 Pain in right toe(s): Secondary | ICD-10-CM

## 2017-10-20 DIAGNOSIS — L600 Ingrowing nail: Secondary | ICD-10-CM | POA: Diagnosis not present

## 2017-10-20 DIAGNOSIS — B351 Tinea unguium: Secondary | ICD-10-CM

## 2017-10-20 NOTE — Progress Notes (Signed)
SUBJECTIVE: 67 y.o. year old female presents with ingrown right great toe nail.  Patient is retired Banker.   Bilateral knee surgery, 2014 and 2017. 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  Review of Systems  Constitutional: Negative.   HENT: Negative.   Eyes: Negative.   Respiratory: Negative.   Cardiovascular: Negative.   Gastrointestinal: Negative.   Genitourinary: Negative.   Musculoskeletal:       Chronic shoulder pain and arthritis. Hx of bilateral knee surgery.  Skin: Negative.   Endo/Heme/Allergies: Negative.      OBJECTIVE: DERMATOLOGIC EXAMINATION: Nails: hypertrophic nails x 10. Ingrown hallucal nail with fungal infection right great toe.  VASCULAR EXAMINATION OF LOWER LIMBS: Pedal pulses are not palpable on both feet. Capillary Filling times within 3 seconds in all digits.  Mild ankle edema bilateral. Temperature gradient from tibial crest to dorsum of foot is within normal bilateral.  NEUROLOGIC EXAMINATION OF THE LOWER LIMBS: All epicritic and tactile sensations grossly intact. Sharp and Dull discriminatory sensations at the plantar ball of hallux is intact bilateral.   MUSCULOSKELETAL EXAMINATION: Positive for dorsal bunion on right.  ASSESSMENT: Painful ingrown nail right great toe medial border. Fungal nail right great toe.  PLAN: Reviewed findings and available treatment options, palliative care or surgical correction.. All nails debrided. Return as needed.

## 2017-10-20 NOTE — Patient Instructions (Signed)
Seen for ingrown nail problem on right great toe. Reviewed findings and available treatment options. All nails debrided. Return as needed.

## 2018-02-02 ENCOUNTER — Other Ambulatory Visit: Payer: Self-pay | Admitting: Obstetrics and Gynecology

## 2018-02-02 DIAGNOSIS — N644 Mastodynia: Secondary | ICD-10-CM

## 2018-02-07 ENCOUNTER — Ambulatory Visit
Admission: RE | Admit: 2018-02-07 | Discharge: 2018-02-07 | Disposition: A | Discharge status: Home / Self Care | Payer: Medicare HMO | Source: Ambulatory Visit | Attending: Obstetrics and Gynecology | Admitting: Obstetrics and Gynecology

## 2018-02-07 DIAGNOSIS — N644 Mastodynia: Secondary | ICD-10-CM

## 2018-06-01 ENCOUNTER — Other Ambulatory Visit: Payer: Self-pay | Admitting: Obstetrics and Gynecology

## 2018-06-01 DIAGNOSIS — Z1231 Encounter for screening mammogram for malignant neoplasm of breast: Secondary | ICD-10-CM

## 2018-07-07 ENCOUNTER — Ambulatory Visit
Admission: RE | Admit: 2018-07-07 | Discharge: 2018-07-07 | Disposition: A | Discharge status: Home / Self Care | Payer: Medicare HMO | Source: Ambulatory Visit | Attending: Obstetrics and Gynecology | Admitting: Obstetrics and Gynecology

## 2018-07-07 DIAGNOSIS — Z1231 Encounter for screening mammogram for malignant neoplasm of breast: Secondary | ICD-10-CM

## 2019-06-02 ENCOUNTER — Other Ambulatory Visit: Payer: Self-pay | Admitting: Obstetrics and Gynecology

## 2019-06-02 DIAGNOSIS — Z1231 Encounter for screening mammogram for malignant neoplasm of breast: Secondary | ICD-10-CM

## 2019-07-17 ENCOUNTER — Other Ambulatory Visit: Payer: Self-pay

## 2019-07-17 ENCOUNTER — Ambulatory Visit
Admission: RE | Admit: 2019-07-17 | Discharge: 2019-07-17 | Disposition: A | Discharge status: Home / Self Care | Payer: Medicare HMO | Source: Ambulatory Visit | Attending: Obstetrics and Gynecology | Admitting: Obstetrics and Gynecology

## 2019-07-17 DIAGNOSIS — Z1231 Encounter for screening mammogram for malignant neoplasm of breast: Secondary | ICD-10-CM

## 2020-06-25 ENCOUNTER — Other Ambulatory Visit: Payer: Self-pay | Admitting: Obstetrics and Gynecology

## 2020-06-25 DIAGNOSIS — Z1231 Encounter for screening mammogram for malignant neoplasm of breast: Secondary | ICD-10-CM

## 2020-07-17 ENCOUNTER — Ambulatory Visit: Payer: Medicare HMO

## 2020-08-21 ENCOUNTER — Other Ambulatory Visit: Payer: Self-pay

## 2020-08-21 ENCOUNTER — Ambulatory Visit
Admission: RE | Admit: 2020-08-21 | Discharge: 2020-08-21 | Disposition: A | Discharge status: Home / Self Care | Payer: Medicare HMO | Source: Ambulatory Visit | Attending: Obstetrics and Gynecology | Admitting: Obstetrics and Gynecology

## 2020-08-21 DIAGNOSIS — Z1231 Encounter for screening mammogram for malignant neoplasm of breast: Secondary | ICD-10-CM

## 2020-10-24 ENCOUNTER — Other Ambulatory Visit: Payer: Self-pay | Admitting: Obstetrics and Gynecology

## 2020-10-24 DIAGNOSIS — N644 Mastodynia: Secondary | ICD-10-CM

## 2020-11-12 ENCOUNTER — Ambulatory Visit: Payer: Medicare HMO

## 2020-11-12 ENCOUNTER — Other Ambulatory Visit: Payer: Self-pay

## 2020-11-12 ENCOUNTER — Ambulatory Visit
Admission: RE | Admit: 2020-11-12 | Discharge: 2020-11-12 | Disposition: A | Discharge status: Home / Self Care | Payer: Medicare HMO | Source: Ambulatory Visit | Attending: Obstetrics and Gynecology | Admitting: Obstetrics and Gynecology

## 2020-11-12 DIAGNOSIS — N644 Mastodynia: Secondary | ICD-10-CM

## 2021-01-07 ENCOUNTER — Ambulatory Visit: Payer: Medicare HMO

## 2021-01-07 NOTE — Progress Notes (Signed)
   Covid-19 Vaccination Clinic  Name:  Elizabeth Singh    MRN: [id number] DOB: 03-30-1951  01/07/2021  [name] was observed post Covid-19 immunization for 15 minutes without incident. She was provided with Vaccine Information Sheet and instruction to access the V-Safe system.   [name] was instructed to call 911 with any severe reactions post vaccine: [name] Kitchen Difficulty breathing  . Swelling of face and throat  . A fast heartbeat  . A bad rash all over body  . Dizziness and weakness     IDOBS

## 2021-07-08 ENCOUNTER — Other Ambulatory Visit (HOSPITAL_BASED_OUTPATIENT_CLINIC_OR_DEPARTMENT_OTHER): Payer: Self-pay

## 2021-07-08 ENCOUNTER — Ambulatory Visit: Payer: Medicare HMO | Attending: Internal Medicine

## 2021-07-08 DIAGNOSIS — Z23 Encounter for immunization: Secondary | ICD-10-CM

## 2021-07-08 MED ORDER — INFLUENZA VAC A&B SA ADJ QUAD 0.5 ML IM PRSY
PREFILLED_SYRINGE | INTRAMUSCULAR | 0 refills | Status: AC
  Filled 2021-07-08: qty 0.5, 1d supply, fill #0

## 2021-07-08 NOTE — Progress Notes (Signed)
   Covid-19 Vaccination Clinic  Name:  SOPHEA RACKHAM    MRN: [id number] DOB: Feb 16, 1951  07/08/2021  Ms. Dorgan was observed post Covid-19 immunization for 15 minutes without incident. She was provided with Vaccine Information Sheet and instruction to access the V-Safe system.   Ms. Haley was instructed to call 911 with any severe reactions post vaccine: Difficulty breathing  Swelling of face and throat  A fast heartbeat  A bad rash all over body  Dizziness and weakness

## 2021-08-05 ENCOUNTER — Other Ambulatory Visit (HOSPITAL_BASED_OUTPATIENT_CLINIC_OR_DEPARTMENT_OTHER): Payer: Self-pay

## 2021-08-05 MED ORDER — PFIZER COVID-19 VAC BIVALENT 30 MCG/0.3ML IM SUSP
INTRAMUSCULAR | 0 refills | Status: AC
  Filled 2021-08-05: qty 0.3, 1d supply, fill #0

## 2021-09-11 ENCOUNTER — Other Ambulatory Visit: Payer: Self-pay | Admitting: Obstetrics and Gynecology

## 2021-09-11 DIAGNOSIS — Z1231 Encounter for screening mammogram for malignant neoplasm of breast: Secondary | ICD-10-CM

## 2021-10-10 ENCOUNTER — Ambulatory Visit
Admission: RE | Admit: 2021-10-10 | Discharge: 2021-10-10 | Disposition: A | Discharge status: Home / Self Care | Payer: Medicare HMO | Source: Ambulatory Visit | Attending: Obstetrics and Gynecology | Admitting: Obstetrics and Gynecology

## 2021-10-10 DIAGNOSIS — Z1231 Encounter for screening mammogram for malignant neoplasm of breast: Secondary | ICD-10-CM

## 2022-07-07 ENCOUNTER — Other Ambulatory Visit (HOSPITAL_BASED_OUTPATIENT_CLINIC_OR_DEPARTMENT_OTHER): Payer: Self-pay

## 2022-07-07 MED ORDER — FLUAD QUADRIVALENT 0.5 ML IM PRSY
PREFILLED_SYRINGE | INTRAMUSCULAR | 0 refills | Status: AC
Start: 2022-07-07 — End: ?
  Filled 2022-07-07: qty 0.5, 1d supply, fill #0

## 2022-07-07 MED ORDER — COMIRNATY 30 MCG/0.3ML IM SUSY
PREFILLED_SYRINGE | INTRAMUSCULAR | 0 refills | Status: AC
Start: 2022-07-07 — End: ?
  Filled 2022-07-07: qty 0.3, 1d supply, fill #0

## 2022-10-02 ENCOUNTER — Other Ambulatory Visit: Payer: Self-pay | Admitting: Obstetrics and Gynecology

## 2022-10-02 DIAGNOSIS — Z1231 Encounter for screening mammogram for malignant neoplasm of breast: Secondary | ICD-10-CM

## 2022-10-13 ENCOUNTER — Ambulatory Visit
Admission: RE | Admit: 2022-10-13 | Discharge: 2022-10-13 | Disposition: A | Discharge status: Home / Self Care | Payer: Medicare HMO | Source: Ambulatory Visit | Attending: Obstetrics and Gynecology | Admitting: Obstetrics and Gynecology

## 2022-10-13 DIAGNOSIS — Z1231 Encounter for screening mammogram for malignant neoplasm of breast: Secondary | ICD-10-CM

## 2023-04-24 ENCOUNTER — Emergency Department (HOSPITAL_BASED_OUTPATIENT_CLINIC_OR_DEPARTMENT_OTHER)
Admission: EM | Admit: 2023-04-24 | Discharge: 2023-04-24 | Disposition: A | Discharge status: Home / Self Care | Payer: Medicare HMO | Attending: Emergency Medicine | Admitting: Emergency Medicine

## 2023-04-24 ENCOUNTER — Other Ambulatory Visit: Payer: Self-pay

## 2023-04-24 ENCOUNTER — Encounter (HOSPITAL_BASED_OUTPATIENT_CLINIC_OR_DEPARTMENT_OTHER): Payer: Self-pay | Admitting: Emergency Medicine

## 2023-04-24 DIAGNOSIS — I10 Essential (primary) hypertension: Secondary | ICD-10-CM | POA: Diagnosis not present

## 2023-04-24 DIAGNOSIS — M25512 Pain in left shoulder: Secondary | ICD-10-CM | POA: Diagnosis not present

## 2023-04-24 DIAGNOSIS — M79602 Pain in left arm: Secondary | ICD-10-CM | POA: Diagnosis not present

## 2023-04-24 DIAGNOSIS — Z79899 Other long term (current) drug therapy: Secondary | ICD-10-CM | POA: Insufficient documentation

## 2023-04-24 DIAGNOSIS — Z91040 Latex allergy status: Secondary | ICD-10-CM | POA: Insufficient documentation

## 2023-04-24 LAB — CBC
HCT: 37.7 % (ref 36.0–46.0)
Hemoglobin: 12.3 g/dL (ref 12.0–15.0)
MCH: 28.9 pg (ref 26.0–34.0)
MCHC: 32.6 g/dL (ref 30.0–36.0)
MCV: 88.5 fL (ref 80.0–100.0)
Platelets: 189 10*3/uL (ref 150–400)
RBC: 4.26 MIL/uL (ref 3.87–5.11)
RDW: 14.2 % (ref 11.5–15.5)
WBC: 6.9 10*3/uL (ref 4.0–10.5)
nRBC: 0 % (ref 0.0–0.2)

## 2023-04-24 LAB — TROPONIN I (HIGH SENSITIVITY): Troponin I (High Sensitivity): 4 ng/L (ref <18)

## 2023-04-24 LAB — BASIC METABOLIC PANEL
Anion gap: 10 (ref 5–15)
BUN: 21 mg/dL (ref 8–23)
CO2: 24 mmol/L (ref 22–32)
Calcium: 9.2 mg/dL (ref 8.9–10.3)
Chloride: 100 mmol/L (ref 98–111)
Creatinine, Ser: 1.11 mg/dL — ABNORMAL HIGH (ref 0.44–1.00)
GFR, Estimated: 52 mL/min — ABNORMAL LOW (ref >60)
Glucose, Bld: 103 mg/dL — ABNORMAL HIGH (ref 70–99)
Potassium: 3.5 mmol/L (ref 3.5–5.1)
Sodium: 134 mmol/L — ABNORMAL LOW (ref 135–145)

## 2023-04-24 MED ORDER — KETOROLAC TROMETHAMINE 15 MG/ML IJ SOLN
15.0000 mg | Freq: Once | INTRAMUSCULAR | Status: AC
  Administered 2023-04-24: 15 mg via INTRAVENOUS
  Filled 2023-04-24: qty 1

## 2023-04-24 MED ORDER — FENTANYL CITRATE PF 50 MCG/ML IJ SOSY
50.0000 ug | PREFILLED_SYRINGE | Freq: Once | INTRAMUSCULAR | Status: DC
  Filled 2023-04-24: qty 1

## 2023-04-24 MED ORDER — HYDROCODONE-ACETAMINOPHEN 5-325 MG PO TABS
1.0000 | ORAL_TABLET | Freq: Once | ORAL | Status: AC
  Administered 2023-04-24: 1 via ORAL
  Filled 2023-04-24: qty 1

## 2023-04-24 MED ORDER — MORPHINE SULFATE (PF) 2 MG/ML IV SOLN
2.0000 mg | Freq: Once | INTRAVENOUS | Status: AC
  Administered 2023-04-24: 2 mg via INTRAVENOUS
  Filled 2023-04-24: qty 1

## 2023-04-24 NOTE — Discharge Instructions (Addendum)
Please call your doctor in 2 days for close follow-up. If you start having new chest pain, shortness of breath or new weakness in your arm in the next 48 hours please return to the ER

## 2023-04-24 NOTE — ED Provider Notes (Signed)
[place] [facility] Provider Note   CSN: [id number] Arrival date & time: 04/24/23  4098     History  Chief Complaint  Patient presents with   Back Pain    Elizabeth Singh is a 72 y.o. female.  The history is provided by the patient.  Patient presents for continued left scapula, left shoulder and arm pain. Patient reports earlier in the week (7/28) she was in [place] at a hotel, while getting up in the middle of the night she hit her head.  She is unsure if she injured her arm but she does recall getting groggy after hitting her head.  Since that time she has had pain in the left shoulder and scapula into her upper arm.  Tonight while sleeping she woke up with worsening pain from the scapula into the left bicep and feels like a squeezing type pain No fevers or vomiting.  No chest pain or shortness of breath.  No diaphoresis.  She would like to have cardiac evaluation.  She is already had CT head and C-spine and a left shoulder x-ray prior to next evaluation.     Home Medications Prior to Admission medications   Medication Sig Start Date End Date Taking? Authorizing Provider  amiloride-hydrochlorothiazide (MODURETIC) 5-50 MG tablet Take 0.5 tablets by mouth daily.    [provider]  COVID-19 mRNA bivalent vaccine, Pfizer, (PFIZER COVID-19 VAC BIVALENT) injection Inject into the muscle. 07/08/21   Judyann Munson, MD  COVID-19 mRNA vaccine [phone] (COMIRNATY) syringe Inject into the muscle. 07/07/22   Judyann Munson, MD  EPINEPHrine 0.3 mg/0.3 mL IJ SOAJ injection Inject 0.3 mLs (0.3 mg total) into the muscle once. For severe allergic reaction with difficulty breathing or swallowing. 10/07/15   Rancour, Jeannett Senior, MD  influenza vaccine adjuvanted (FLUAD QUADRIVALENT) 0.5 ML injection Inject into the muscle. 07/07/22   Judyann Munson, MD  influenza vaccine adjuvanted (FLUAD) 0.5 ML injection Inject into the muscle. 07/08/21   Judyann Munson, MD  latanoprost (XALATAN) 0.005 % ophthalmic solution Place 1 drop into both eyes at bedtime.    [provider]  metoprolol (LOPRESSOR) 50 MG tablet Take 50 mg by mouth 2 (two) times daily.    [provider]  potassium chloride SA (K-DUR,KLOR-CON) 20 MEQ tablet Take 20 mEq by mouth every morning.    [provider]      Allergies    Sulfa antibiotics and Latex    Review of Systems   Review of Systems  Constitutional:  Negative for diaphoresis and fever.  Respiratory:  Negative for shortness of breath.   Cardiovascular:  Negative for chest pain.  Gastrointestinal:  Negative for vomiting.  Musculoskeletal:  Positive for arthralgias.  Neurological:  Negative for weakness.    Physical Exam Updated Vital Signs BP (!) 158/65 (BP Location: Right Arm)   Pulse (!) 59   Temp 97.9 F (36.6 C) (Oral)   Resp 18   Ht 1.626 m (5\' 4" )   Wt 91.2 kg   SpO2 99%   BMI 34.50 kg/m  Physical Exam CONSTITUTIONAL: Elderly, no acute distress HEAD: Hematoma noted to forehead from previous head injury EYES: EOMI/PERRL ENMT: Mucous membranes moist NECK: supple no meningeal signs SPINE/BACK:entire spine nontender CV: S1/S2 noted, no murmurs/rubs/gallops noted LUNGS: Lungs are clear to auscultation bilaterally, no apparent distress ABDOMEN: soft, nontender, NEURO: Pt is awake/alert/appropriate, moves all extremitiesx4.  No facial droop.Equal power (5/5) with hand grip, wrist flex/extension, elbow flex/extension, and equal power  with shoulder abduction/adduction.  No focal sensory deficit to light touch is noted in either UE.   EXTREMITIES: pulses normal/equal, full ROM Tenderness to palpation of the left scapula but no bruising is noted Arms are symmetric.  Tenderness noted to the left bicep. Full range of motion of left shoulder and left elbow. SKIN: warm, color normal PSYCH: no abnormalities of mood noted, alert and oriented to situation  ED Results /  Procedures / Treatments   Labs (all labs ordered are listed, but only abnormal results are displayed) Labs Reviewed  BASIC METABOLIC PANEL - Abnormal; Notable for the following components:      Result Value   Sodium 134 (*)    Glucose, Bld 103 (*)    Creatinine, Ser 1.11 (*)    GFR, Estimated 52 (*)    All other components within normal limits  CBC  TROPONIN I (HIGH SENSITIVITY)    EKG EKG Interpretation Date/Time:  Saturday April 24 2023 03:34:33 EDT Ventricular Rate:  62 PR Interval:  53 QRS Duration:  105 QT Interval:  450 QTC Calculation: 457 R Axis:   24  Text Interpretation: Sinus rhythm Short PR interval Interpretation limited secondary to artifact No significant change since last tracing Confirmed by Zadie Rhine ([id number]) on 04/24/2023 3:38:25 AM  Radiology No results found.  Procedures Procedures    Medications Ordered in ED Medications  ketorolac (TORADOL) 15 MG/ML injection 15 mg (has no administration in time range)  HYDROcodone-acetaminophen (NORCO/VICODIN) 5-325 MG per tablet 1 tablet (1 tablet Oral Given 04/24/23 0342)  morphine (PF) 2 MG/ML injection 2 mg (2 mg Intravenous Given 04/24/23 0444)    ED Course/ Medical Decision Making/ A&P Clinical Course as of 04/24/23 0532  Sat Apr 24, 2023  0347 Patient accidentally struck her head around July 28 while out of town.  Since that time she has had pain in the left scapula, shoulder and left arm.  She has already had patient workup including CT head and C-spine and just recently had a left shoulder x-ray.  She has NSAIDs and muscle relaxants at home.  Patient is concerned this is an underlying cardiac issue.  Initial EKG is unremarkable.  Will check labs and reassess.  She declines a chest x-ray [DW]  0531 Overall patient is improving.  The pain is reproducible with palpation of the left scapula with some movement of her arm.  Suspect this could be a radiculopathy versus shoulder impingement syndrome She has not  had any chest pain or shortness of breath.  EKG and troponin are unremarkable.  Given that her symptoms been ongoing for days, will defer any further workup.  Low suspicion for cardiac cause.  Will give one-time dose of Toradol.  She will skip her next dose of NSAIDs at home.  She also has muscle relaxants at home to take [DW]    Clinical Course User Index [DW] Zadie Rhine, MD                                 Medical Decision Making Amount and/or Complexity of Data Reviewed Labs: ordered. ECG/medicine tests: ordered.  Risk Prescription drug management.   This patient presents to the ED for concern of arm pain, this involves an extensive number of treatment options, and is a complaint that carries with it a high risk of complications and morbidity.  The differential diagnosis includes but is not limited to acute coronary syndrome, aortic dissection,  muscle strain, cervical radiculopathy, rotator cuff injury  Comorbidities that complicate the patient evaluation: Patient's presentation is complicated by their history of hypertension   Additional history obtained: Records reviewed Care Everywhere/External Records  Lab Tests: I Ordered, and personally interpreted labs.  The pertinent results include: Labs overall unremarkable   Cardiac Monitoring: The patient was maintained on a cardiac monitor.  I personally viewed and interpreted the cardiac monitor which showed an underlying rhythm of:  sinus rhythm  Medicines ordered and prescription drug management: I ordered medication including Vicodin for pain Reevaluation of the patient after these medicines showed that the patient    stayed the same   Reevaluation: After the interventions noted above, I reevaluated the patient and found that they have :improved  Complexity of problems addressed: Patient's presentation is most consistent with  acute presentation with potential threat to life or bodily function  Disposition: After  consideration of the diagnostic results and the patient's response to treatment,  I feel that the patent would benefit from discharge   .           Final Clinical Impression(s) / ED Diagnoses Final diagnoses:  Left arm pain    Rx / DC Orders ED Discharge Orders     None         Zadie Rhine, MD 04/24/23 [phone]

## 2023-04-24 NOTE — ED Triage Notes (Signed)
Left side Scapula and arm pain with some numbness in fingers, woke pt up about 0100, has been  happening since Sunday but this felt deferent tonight and wanted to rule out cardiac issue. Has been seen for arm/back pain and has been treated for.

## 2023-06-14 ENCOUNTER — Other Ambulatory Visit (HOSPITAL_BASED_OUTPATIENT_CLINIC_OR_DEPARTMENT_OTHER): Payer: Self-pay

## 2023-06-14 MED ORDER — COVID-19 MRNA VAC-TRIS(PFIZER) 30 MCG/0.3ML IM SUSY
0.3000 mL | PREFILLED_SYRINGE | Freq: Once | INTRAMUSCULAR | 0 refills | Status: AC
  Filled 2023-06-14: qty 0.3, 1d supply, fill #0

## 2023-06-14 MED ORDER — INFLUENZA VAC A&B SURF ANT ADJ 0.5 ML IM SUSY
0.5000 mL | PREFILLED_SYRINGE | Freq: Once | INTRAMUSCULAR | 0 refills | Status: AC
  Filled 2023-06-14: qty 0.5, 1d supply, fill #0

## 2023-06-15 ENCOUNTER — Other Ambulatory Visit (HOSPITAL_BASED_OUTPATIENT_CLINIC_OR_DEPARTMENT_OTHER): Payer: Self-pay

## 2023-10-08 ENCOUNTER — Other Ambulatory Visit: Payer: Self-pay | Admitting: Obstetrics and Gynecology

## 2023-10-08 DIAGNOSIS — Z1231 Encounter for screening mammogram for malignant neoplasm of breast: Secondary | ICD-10-CM

## 2023-10-25 ENCOUNTER — Ambulatory Visit: Payer: Medicare HMO

## 2023-11-02 ENCOUNTER — Ambulatory Visit
Admission: RE | Admit: 2023-11-02 | Discharge: 2023-11-02 | Disposition: A | Discharge status: Home / Self Care | Payer: Medicare HMO | Source: Ambulatory Visit | Attending: Obstetrics and Gynecology | Admitting: Obstetrics and Gynecology

## 2023-11-02 DIAGNOSIS — Z1231 Encounter for screening mammogram for malignant neoplasm of breast: Secondary | ICD-10-CM

## 2024-01-17 IMAGING — MG MM DIGITAL SCREENING BILAT W/ TOMO AND CAD
8 series · 8 of 24 positions shown · non-contrast
Comparison: Previous exam(s).

ACR Breast Density Category a: The breast tissue is almost entirely
fatty.

CLINICAL DATA: Screening.

EXAM:
DIGITAL SCREENING BILATERAL MAMMOGRAM WITH TOMOSYNTHESIS AND CAD
TECHNIQUE: Bilateral screening digital craniocaudal and mediolateral oblique
mammograms were obtained. Bilateral screening digital breast
tomosynthesis was performed. The images were evaluated with
computer-aided detection.

[R MLO synth-2D]
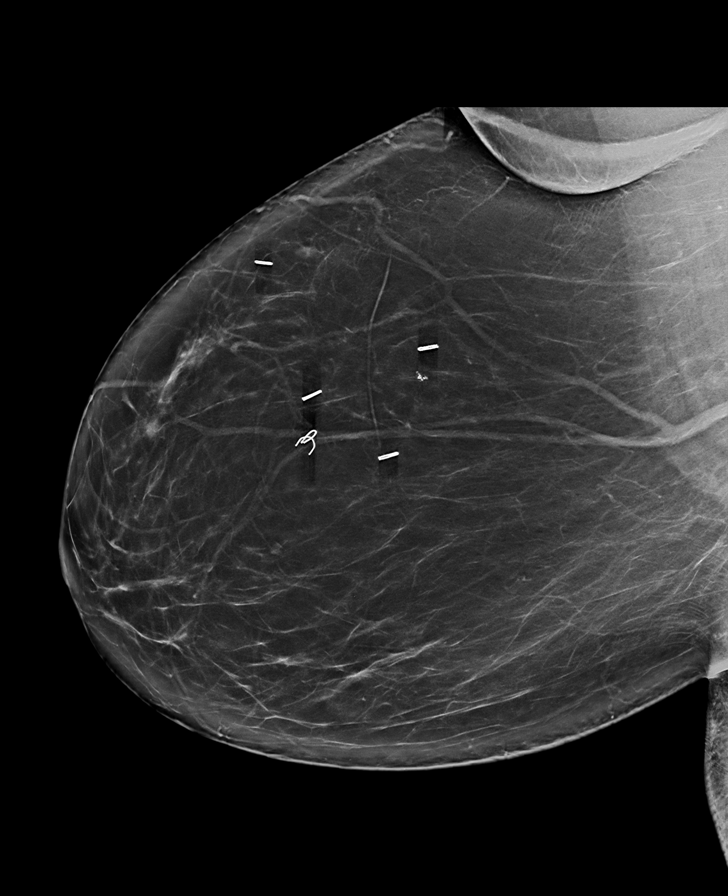

[L MLO synth-2D]
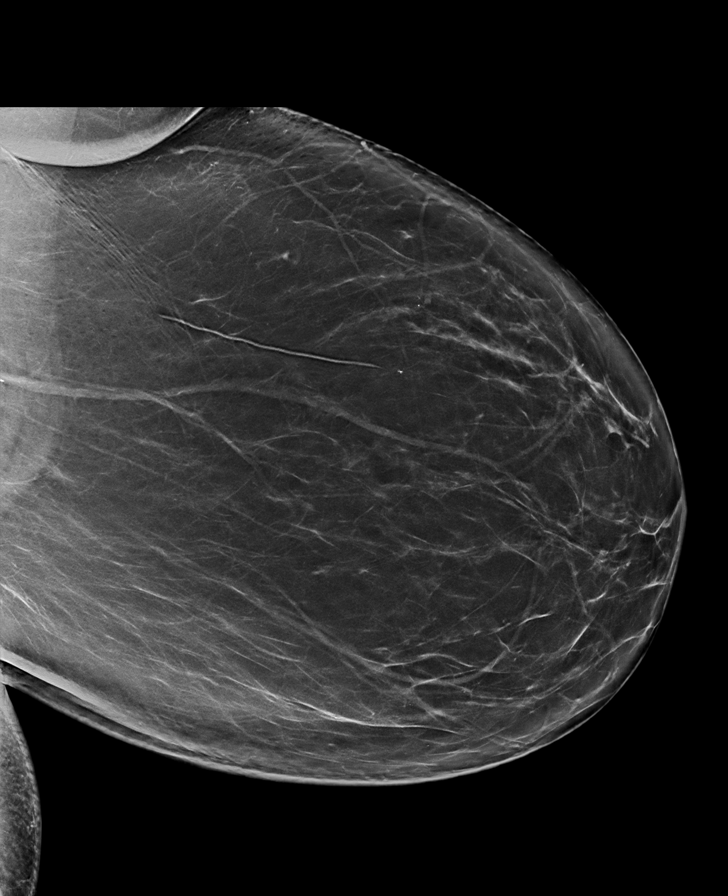

[L CC synth-2D]
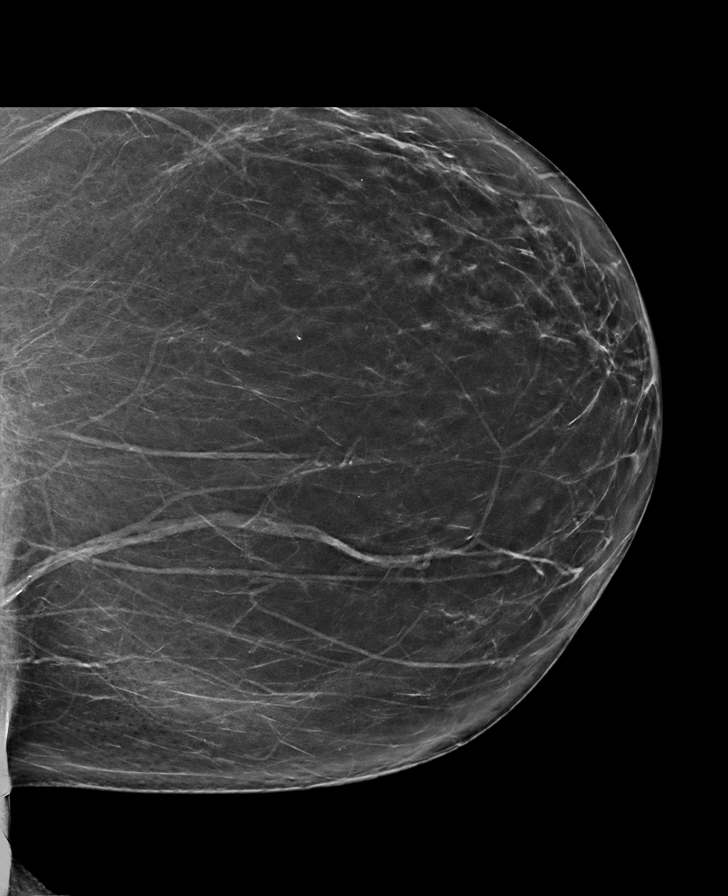

[R CC synth-2D]
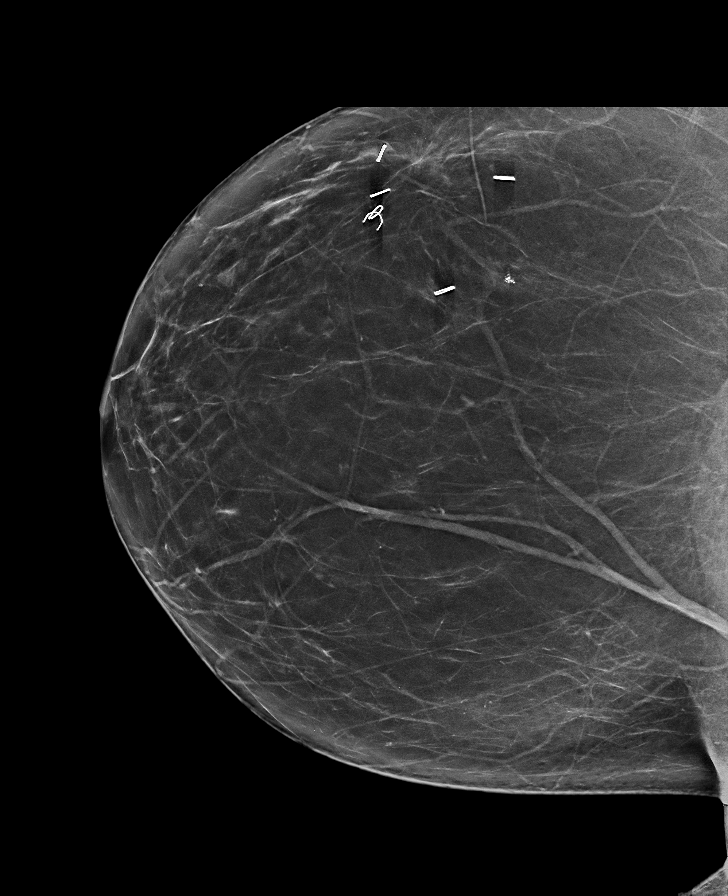

[R CC tomo · tomo slice 37/72.0]
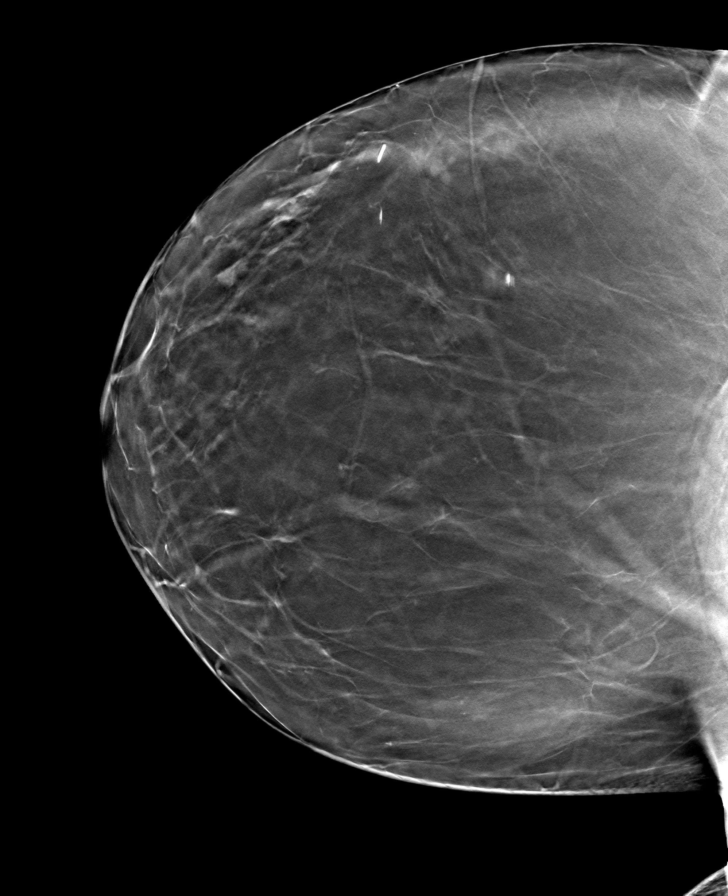

[L CC tomo · tomo slice 38/75.0]
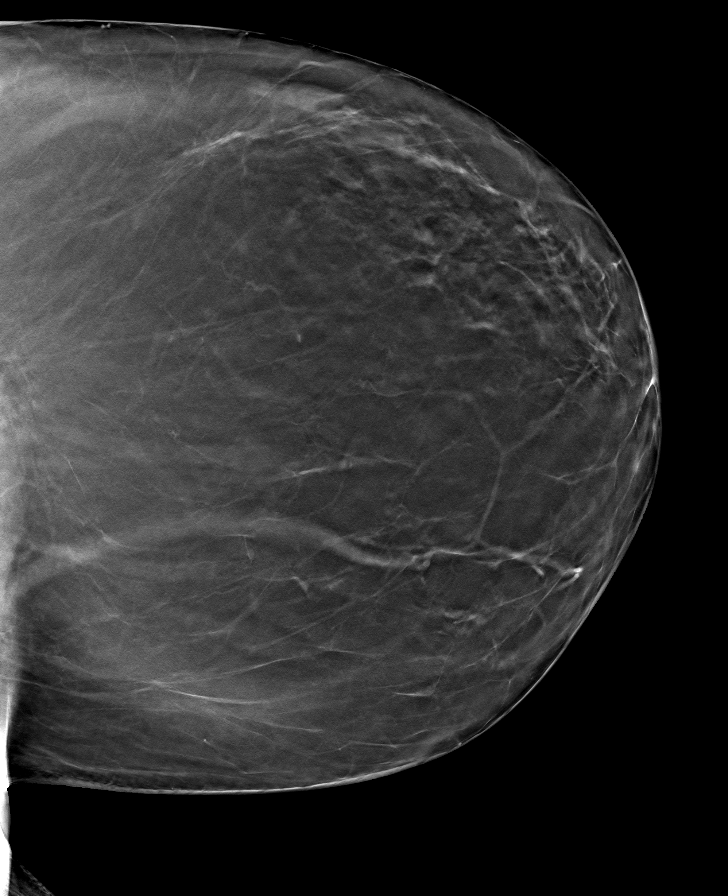

[L MLO tomo · tomo slice 45/90.0]
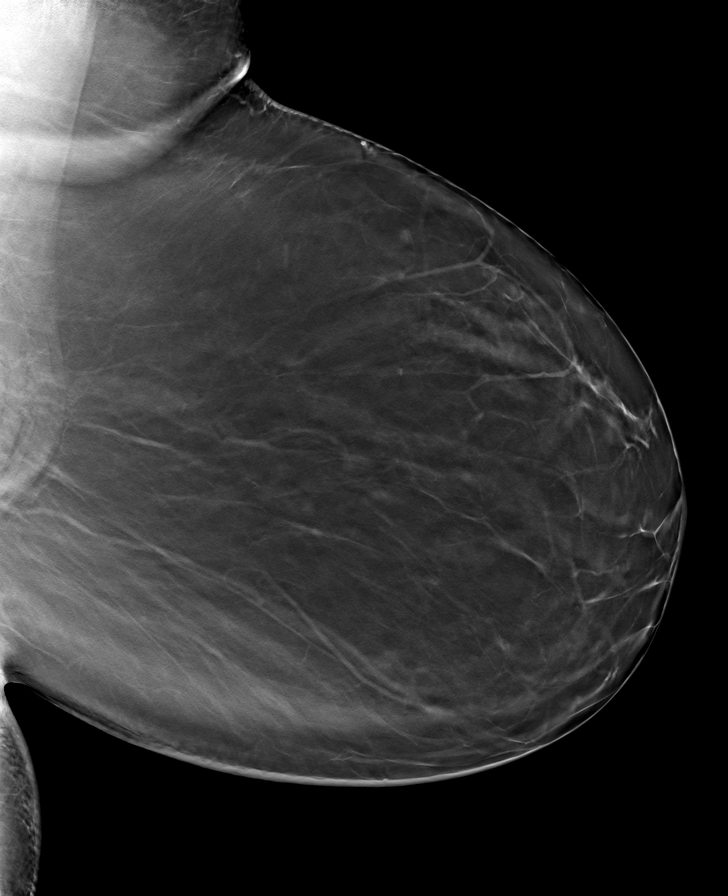

[R MLO tomo · tomo slice 45/90.0]
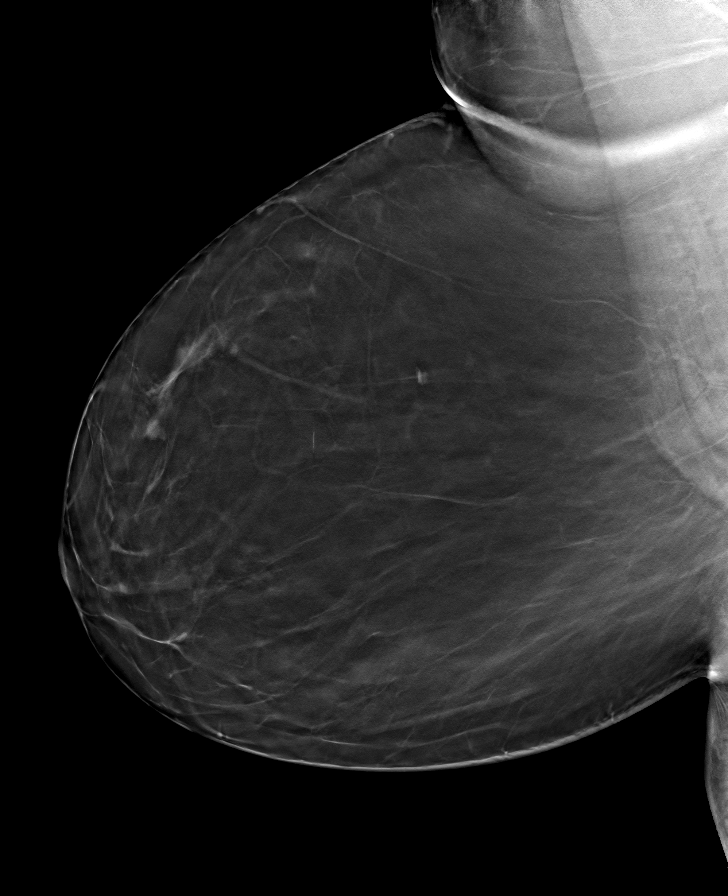

[8 of 24 positions shown; findings below may reference images not displayed]

FINDINGS: There are no findings suspicious for malignancy.
IMPRESSION: No mammographic evidence of malignancy. A result letter of this
screening mammogram will be mailed directly to the patient.

RECOMMENDATION:
Screening mammogram in one year. (Code:0E-3-N98)

BI-RADS CATEGORY  1: Negative.

## 2024-05-19 ENCOUNTER — Encounter (HOSPITAL_COMMUNITY): Payer: Self-pay

## 2024-05-19 ENCOUNTER — Emergency Department (HOSPITAL_COMMUNITY)

## 2024-05-19 ENCOUNTER — Emergency Department (HOSPITAL_COMMUNITY)
Admission: EM | Admit: 2024-05-19 | Discharge: 2024-05-19 | Disposition: A | Discharge status: Home / Self Care | Attending: Emergency Medicine | Admitting: Emergency Medicine

## 2024-05-19 ENCOUNTER — Other Ambulatory Visit: Payer: Self-pay

## 2024-05-19 DIAGNOSIS — S0993XA Unspecified injury of face, initial encounter: Secondary | ICD-10-CM | POA: Diagnosis present

## 2024-05-19 DIAGNOSIS — W01198A Fall on same level from slipping, tripping and stumbling with subsequent striking against other object, initial encounter: Secondary | ICD-10-CM | POA: Insufficient documentation

## 2024-05-19 DIAGNOSIS — S0181XA Laceration without foreign body of other part of head, initial encounter: Secondary | ICD-10-CM | POA: Diagnosis not present

## 2024-05-19 DIAGNOSIS — W19XXXA Unspecified fall, initial encounter: Secondary | ICD-10-CM

## 2024-05-19 LAB — COMPREHENSIVE METABOLIC PANEL WITH GFR
ALT: 11 U/L (ref 0–44)
AST: 51 U/L — ABNORMAL HIGH (ref 15–41)
Albumin: 3.6 g/dL (ref 3.5–5.0)
Alkaline Phosphatase: 75 U/L (ref 38–126)
Anion gap: 9 (ref 5–15)
BUN: 22 mg/dL (ref 8–23)
CO2: 23 mmol/L (ref 22–32)
Calcium: 9.5 mg/dL (ref 8.9–10.3)
Chloride: 105 mmol/L (ref 98–111)
Creatinine, Ser: 1.25 mg/dL — ABNORMAL HIGH (ref 0.44–1.00)
GFR, Estimated: 45 mL/min — ABNORMAL LOW (ref >60)
Glucose, Bld: 102 mg/dL — ABNORMAL HIGH (ref 70–99)
Potassium: 4.3 mmol/L (ref 3.5–5.1)
Sodium: 137 mmol/L (ref 135–145)
Total Bilirubin: 0.4 mg/dL (ref 0.0–1.2)
Total Protein: 7.7 g/dL (ref 6.5–8.1)

## 2024-05-19 LAB — CBC WITH DIFFERENTIAL/PLATELET
Abs Immature Granulocytes: 0.02 K/uL (ref 0.00–0.07)
Basophils Absolute: 0.1 K/uL (ref 0.0–0.1)
Basophils Relative: 1 %
Eosinophils Absolute: 0.3 K/uL (ref 0.0–0.5)
Eosinophils Relative: 3 %
HCT: 36 % (ref 36.0–46.0)
Hemoglobin: 11.6 g/dL — ABNORMAL LOW (ref 12.0–15.0)
Immature Granulocytes: 0 %
Lymphocytes Relative: 21 %
Lymphs Abs: 1.9 K/uL (ref 0.7–4.0)
MCH: 29.5 pg (ref 26.0–34.0)
MCHC: 32.2 g/dL (ref 30.0–36.0)
MCV: 91.6 fL (ref 80.0–100.0)
Monocytes Absolute: 0.7 K/uL (ref 0.1–1.0)
Monocytes Relative: 8 %
Neutro Abs: 6 K/uL (ref 1.7–7.7)
Neutrophils Relative %: 67 %
Platelets: 193 K/uL (ref 150–400)
RBC: 3.93 MIL/uL (ref 3.87–5.11)
RDW: 14.5 % (ref 11.5–15.5)
WBC: 9 K/uL (ref 4.0–10.5)
nRBC: 0 % (ref 0.0–0.2)

## 2024-05-19 MED ORDER — ACETAMINOPHEN 500 MG PO TABS
1000.0000 mg | ORAL_TABLET | Freq: Once | ORAL | Status: AC
  Administered 2024-05-19: 1000 mg via ORAL
  Filled 2024-05-19: qty 2

## 2024-05-19 MED ORDER — LIDOCAINE-EPINEPHRINE (PF) 2 %-1:200000 IJ SOLN
10.0000 mL | Freq: Once | INTRAMUSCULAR | Status: AC
  Administered 2024-05-19: 10 mL via INTRADERMAL
  Filled 2024-05-19: qty 20

## 2024-05-19 MED ORDER — SODIUM CHLORIDE 0.9 % IV BOLUS
1000.0000 mL | Freq: Once | INTRAVENOUS | Status: AC
  Administered 2024-05-19: 1000 mL via INTRAVENOUS

## 2024-05-19 MED ORDER — OXYCODONE HCL 5 MG PO TABS
5.0000 mg | ORAL_TABLET | Freq: Once | ORAL | Status: DC
  Filled 2024-05-19: qty 1

## 2024-05-19 NOTE — ED Notes (Signed)
 PT D/C'D AFTER INSTRUCTIONS REVIEWED. PT VERBALIZED UNDERSTANDING. NAD REPORTED OR NOTED AT THIS TIME.

## 2024-05-19 NOTE — ED Provider Notes (Signed)
 [place] EMERGENCY DEPARTMENT AT [facility] Provider Note   CSN: [id number] Arrival date & time: 05/19/24  1825     Patient presents with: Elizabeth Singh is a 73 y.o. female.   46 yoF with a chief complaints of an event where she lost consciousness.  The patient tells me she was at a wedding and she suddenly felt very hot and she told the friend that she needed to go inside to the air conditioning and next thing she knew she woke up on the ground.  She had hit her head when she collapsed.  Complaining of a right-sided headache and right-sided facial pain.  She denies neck pain chest pain abdominal pain extremity pain.  Prior to this event she had been feeling well.  Has been eating and drinking normally no nausea vomiting or diarrhea no abdominal pain no chest pain or difficulty breathing.  No cough or congestion.  No dark stool or blood in her stool.  Takes a baby aspirin every day.        Prior to Admission medications   Medication Sig Start Date End Date Taking? Authorizing Provider  amiloride-hydrochlorothiazide  (MODURETIC) 5-50 MG tablet Take 0.5 tablets by mouth daily.    [provider]  COVID-19 mRNA bivalent vaccine, Pfizer, (PFIZER COVID-19 VAC BIVALENT) injection Inject into the muscle. 07/08/21   Luiz Channel, MD  COVID-19 mRNA vaccine [phone] (COMIRNATY ) syringe Inject into the muscle. 07/07/22   Luiz Channel, MD  EPINEPHrine  0.3 mg/0.3 mL IJ SOAJ injection Inject 0.3 mLs (0.3 mg total) into the muscle once. For severe allergic reaction with difficulty breathing or swallowing. 10/07/15   Rancour, Garnette, MD  influenza vaccine adjuvanted (FLUAD  QUADRIVALENT) 0.5 ML injection Inject into the muscle. 07/07/22   Luiz Channel, MD  influenza vaccine adjuvanted (FLUAD ) 0.5 ML injection Inject into the muscle. 07/08/21   Luiz Channel, MD  latanoprost  (XALATAN ) 0.005 % ophthalmic solution Place 1 drop into both eyes at bedtime.     [provider]  metoprolol  (LOPRESSOR ) 50 MG tablet Take 50 mg by mouth 2 (two) times daily.    [provider]  potassium chloride  SA (K-DUR,KLOR-CON ) 20 MEQ tablet Take 20 mEq by mouth every morning.    [provider]    Allergies: Sulfa antibiotics and Latex    Review of Systems  Updated Vital Signs BP 132/77   Pulse 77   Temp 98.3 F (36.8 C)   Resp 18   Ht 5' 4 (1.626 m)   Wt 92.5 kg   SpO2 100%   BMI 35.02 kg/m   Physical Exam Vitals and nursing note reviewed.  Constitutional:      General: She is not in acute distress.    Appearance: She is well-developed. She is not diaphoretic.  HENT:     Head: Normocephalic.     Comments: Laceration just below the right eyebrow.  Small punctate laceration to the right zygomatic arch.  Edema to the right side of the upper lip.  No obvious dental displacement or fracture. Eyes:     Pupils: Pupils are equal, round, and reactive to light.  Cardiovascular:     Rate and Rhythm: Normal rate and regular rhythm.     Heart sounds: No murmur heard.    No friction rub. No gallop.  Pulmonary:     Effort: Pulmonary effort is normal.     Breath sounds: No wheezing or rales.  Abdominal:     General: There is  no distension.     Palpations: Abdomen is soft.     Tenderness: There is no abdominal tenderness.  Musculoskeletal:        General: No tenderness.     Cervical back: Normal range of motion and neck supple.  Skin:    General: Skin is warm and dry.  Neurological:     Mental Status: She is alert and oriented to person, place, and time.  Psychiatric:        Behavior: Behavior normal.     (all labs ordered are listed, but only abnormal results are displayed) Labs Reviewed  CBC WITH DIFFERENTIAL/PLATELET - Abnormal; Notable for the following components:      Result Value   Hemoglobin 11.6 (*)    All other components within normal limits  COMPREHENSIVE METABOLIC PANEL WITH GFR - Abnormal; Notable  for the following components:   Glucose, Bld 102 (*)    Creatinine, Ser 1.25 (*)    AST 51 (*)    GFR, Estimated 45 (*)    All other components within normal limits    EKG: None  Radiology: CT Maxillofacial Wo Contrast Addendum Date: 05/19/2024 ADDENDUM REPORT: 05/19/2024 20:12 ADDENDUM: Incompletely visualized suspected hypodense thyroid nodule on the right measuring 2.7 cm on series 5, image 11. Recommend thyroid US  (ref: J Am Coll Radiol. 2015 Feb;12(2): 143-50).This should be performed non emergently. Electronically Signed   By: Luke Bun M.D.   On: 05/19/2024 20:12   Result Date: 05/19/2024 CLINICAL DATA:  Facial trauma EXAM: CT MAXILLOFACIAL WITHOUT CONTRAST TECHNIQUE: Multidetector CT imaging of the maxillofacial structures was performed. Multiplanar CT image reconstructions were also generated. RADIATION DOSE REDUCTION: This exam was performed according to the departmental dose-optimization program which includes automated exposure control, adjustment of the mA and/or kV according to patient size and/or use of iterative reconstruction technique. COMPARISON:  None Available. FINDINGS: Osseous: Mastoid air cells are clear. Mandibular heads are normally position. No mandibular fracture. Pterygoid plates and zygomatic arches appear intact. No acute nasal bone fracture Orbits: Negative. No traumatic or inflammatory finding. Sinuses: Clear. Soft tissues: Mild right periorbital and forehead soft tissue hematoma. Mild right cheek contusion. Limited intracranial: See separately dictated head CT IMPRESSION: 1. No CT evidence for acute facial bone fracture. 2. Mild right periorbital and forehead soft tissue hematoma. Mild right cheek contusion. Electronically Signed: By: Luke Bun M.D. On: 05/19/2024 19:57   CT Head Wo Contrast Result Date: 05/19/2024 CLINICAL DATA:  Head trauma EXAM: CT HEAD WITHOUT CONTRAST TECHNIQUE: Contiguous axial images were obtained from the base of the skull through  the vertex without intravenous contrast. RADIATION DOSE REDUCTION: This exam was performed according to the departmental dose-optimization program which includes automated exposure control, adjustment of the mA and/or kV according to patient size and/or use of iterative reconstruction technique. COMPARISON:  MRI 06/02/2023, CT brain 04/20/2023 FINDINGS: Brain: No acute territorial infarction, hemorrhage or intracranial mass. The ventricles are nonenlarged Vascular: No hyperdense vessels.  Carotid vascular calcification Skull: Normal. Negative for fracture or focal lesion. Sinuses/Orbits: No acute finding. Other: Right forehead scalp hematoma IMPRESSION: 1. No CT evidence for acute intracranial abnormality. 2. Right forehead scalp hematoma. Electronically Signed   By: Luke Bun M.D.   On: 05/19/2024 19:52     .Laceration Repair  Date/Time: 05/19/2024 7:57 PM  Performed by: Emil Share, DO Authorized by: Emil Share, DO   Consent:    Consent obtained:  Verbal   Consent given by:  Patient   Risks, benefits, and  alternatives were discussed: yes     Risks discussed:  Infection, pain, poor cosmetic result and poor wound healing   Alternatives discussed:  No treatment Universal protocol:    Procedure explained and questions answered to patient or proxy's satisfaction: yes     Immediately prior to procedure, a time out was called: yes     Patient identity confirmed:  Verbally with patient Anesthesia:    Anesthesia method:  Local infiltration   Local anesthetic:  Lidocaine  2% WITH epi Laceration details:    Location:  Face   Face location:  R eyebrow   Length (cm):  2.8 Pre-procedure details:    Preparation:  Patient was prepped and draped in usual sterile fashion Exploration:    Hemostasis achieved with:  Epinephrine  and direct pressure   Wound extent: no underlying fracture     Contaminated: no   Treatment:    Area cleansed with:  Chlorhexidine    Amount of cleaning:  Standard    Irrigation solution:  Sterile saline   Irrigation volume:  50   Irrigation method:  Pressure wash   Visualized foreign bodies/material removed: no     Debridement:  None   Undermining:  None   Scar revision: no   Skin repair:    Repair method:  Sutures   Suture size:  5-0   Suture material:  Fast-absorbing gut   Suture technique:  Simple interrupted   Number of sutures:  4 Approximation:    Approximation:  Close Repair type:    Repair type:  Simple Post-procedure details:    Dressing:  Antibiotic ointment and adhesive bandage   Procedure completion:  Tolerated well, no immediate complications .Laceration Repair  Date/Time: 05/19/2024 7:58 PM  Performed by: Emil Share, DO Authorized by: Emil Share, DO   Consent:    Consent obtained:  Verbal   Consent given by:  Patient   Risks, benefits, and alternatives were discussed: yes     Risks discussed:  Infection, pain, poor cosmetic result and poor wound healing   Alternatives discussed:  No treatment Universal protocol:    Procedure explained and questions answered to patient or proxy's satisfaction: yes     Immediately prior to procedure, a time out was called: yes     Patient identity confirmed:  Verbally with patient Anesthesia:    Anesthesia method:  Local infiltration   Local anesthetic:  Lidocaine  2% WITH epi Laceration details:    Location:  Face   Face location:  R cheek   Length (cm):  0.5 Pre-procedure details:    Preparation:  Patient was prepped and draped in usual sterile fashion Exploration:    Hemostasis achieved with:  Epinephrine  and direct pressure   Imaging obtained comment:  CT   Imaging outcome: foreign body not noted     Wound exploration: entire depth of wound visualized     Wound extent: no underlying fracture   Treatment:    Area cleansed with:  Chlorhexidine    Amount of cleaning:  Standard   Irrigation solution:  Sterile saline   Irrigation volume:  50   Irrigation method:  Pressure wash    Debridement:  None   Undermining:  None   Scar revision: no   Skin repair:    Repair method:  Sutures   Suture size:  5-0   Suture material:  Fast-absorbing gut   Suture technique:  Simple interrupted   Number of sutures:  1 Approximation:    Approximation:  Close Repair type:  Repair type:  Simple Post-procedure details:    Dressing:  Antibiotic ointment and adhesive bandage   Procedure completion:  Tolerated well, no immediate complications    Medications Ordered in the ED  oxyCODONE  (Oxy IR/ROXICODONE ) immediate release tablet 5 mg (5 mg Oral Patient Refused/Not Given 05/19/24 1938)  lidocaine -EPINEPHrine  (XYLOCAINE  W/EPI) 2 %-1:200000 (PF) injection 10 mL (10 mLs Intradermal Given by Other 05/19/24 1854)  sodium chloride  0.9 % bolus 1,000 mL (0 mLs Intravenous Stopped 05/19/24 2019)  acetaminophen  (TYLENOL ) tablet 1,000 mg (1,000 mg Oral Given 05/19/24 1937)                                    Medical Decision Making Amount and/or Complexity of Data Reviewed Labs: ordered. Radiology: ordered.  Risk OTC drugs. Prescription drug management.   73 yo F with a chief plaints of an event where she lost consciousness.  Sounds vasovagal by history.  She felt very warm and then collapsed to the ground and now feels better.  She did strike her head and is complaining of a right-sided headache.  She denies any neck pain full range of motion of the neck without obvious discomfort.  Will obtain CT imaging of the head and face.  Blood work.  IV fluids.  Sutured wound bedside.  Reassess.  CT of the head and face without obvious acute intracranial pathology, no obvious acute facial fracture.  No acute anemia no significant electrolyte abnormalities.  10:38 PM:  I have discussed the diagnosis/risks/treatment options with the patient and family.  Evaluation and diagnostic testing in the emergency department does not suggest an emergent condition requiring admission or immediate intervention  beyond what has been performed at this time.  They will follow up with PCP. We also discussed returning to the ED immediately if new or worsening sx occur. We discussed the sx which are most concerning (e.g., sudden worsening pain, fever, inability to tolerate by mouth, worsening headache, confusion, vomiting) that necessitate immediate return. Medications administered to the patient during their visit and any new prescriptions provided to the patient are listed below.  Medications given during this visit Medications  oxyCODONE  (Oxy IR/ROXICODONE ) immediate release tablet 5 mg (5 mg Oral Patient Refused/Not Given 05/19/24 1938)  lidocaine -EPINEPHrine  (XYLOCAINE  W/EPI) 2 %-1:200000 (PF) injection 10 mL (10 mLs Intradermal Given by Other 05/19/24 1854)  sodium chloride  0.9 % bolus 1,000 mL (0 mLs Intravenous Stopped 05/19/24 2019)  acetaminophen  (TYLENOL ) tablet 1,000 mg (1,000 mg Oral Given 05/19/24 1937)     The patient appears reasonably screen and/or stabilized for discharge and I doubt any other medical condition or other [facility] requiring further screening, evaluation, or treatment in the ED at this time prior to discharge.       Final diagnoses:  Fall, initial encounter  Facial laceration, initial encounter    ED Discharge Orders     None          Emil Share, DO 05/19/24 2238

## 2024-05-19 NOTE — Discharge Instructions (Signed)
 Please return for sudden worsening headache confusion or vomiting.  Your kidney function was slightly worse than it is normally.  This could mean that you are a bit dehydrated.  Please follow with your family doctor for recheck in a week or 2.  Eat and drink as well as you can for the next few days.  Return for redness drainage or if you get a fever.  The sutures that were used are dissolvable that should dissolve between day 3 and day 5.  If they are still there after day 7 then you can gently plucked them out with tweezers.  The area can get wet but not fully immersed underwater.  No scrubbing.  If you really want to clean it you can apply a half-and-half hydrogen peroxide solution with water  on a Q-tip.  You can apply an ointment a couple times a day this could be as simple as Vaseline but could also be an antibiotic ointment if you wish.  Once it is healed please try to avoid prolonged sun exposure use sunscreen.  Gells that have silicone antigens have been shown to reduce scarring in some research.

## 2024-05-19 NOTE — ED Triage Notes (Addendum)
 Pt bib EMS from outdoor wedding after syncopal episode. Pt went down on [place] per EMS. Pt states she was feeling over heated and little lightheaded before fainting. Pt hit head with small lac about right eyebrow. Denies thinners. A+Ox4 now.   118/60, CBG 111  20 L hand.

## 2024-07-28 ENCOUNTER — Other Ambulatory Visit (HOSPITAL_BASED_OUTPATIENT_CLINIC_OR_DEPARTMENT_OTHER): Payer: Self-pay

## 2024-07-28 MED ORDER — FLUZONE HIGH-DOSE 0.5 ML IM SUSY
0.5000 mL | PREFILLED_SYRINGE | Freq: Once | INTRAMUSCULAR | 0 refills | Status: AC
  Filled 2024-07-28: qty 0.5, 1d supply, fill #0

## 2024-07-28 MED ORDER — COMIRNATY 30 MCG/0.3ML IM SUSY
0.3000 mL | PREFILLED_SYRINGE | Freq: Once | INTRAMUSCULAR | 0 refills | Status: AC
  Filled 2024-07-28: qty 0.3, 1d supply, fill #0
